# Patient Record
Sex: Male | Born: 1950 | Race: Black or African American | Hispanic: No | Marital: Single | Smoking: Current every day smoker
Health system: Southern US, Community
[De-identification: ages and names within clinical notes are randomized; demographics above are authoritative.]

## PROBLEM LIST (undated history)

## (undated) DIAGNOSIS — J449 Chronic obstructive pulmonary disease, unspecified: Secondary | ICD-10-CM

## (undated) DIAGNOSIS — C229 Malignant neoplasm of liver, not specified as primary or secondary: Secondary | ICD-10-CM

## (undated) DIAGNOSIS — I10 Essential (primary) hypertension: Secondary | ICD-10-CM

## (undated) DIAGNOSIS — R918 Other nonspecific abnormal finding of lung field: Secondary | ICD-10-CM

## (undated) HISTORY — DX: Essential (primary) hypertension: I10

## (undated) HISTORY — DX: Other nonspecific abnormal finding of lung field: R91.8

## (undated) HISTORY — DX: Chronic obstructive pulmonary disease, unspecified: J44.9

---

## 1998-07-09 ENCOUNTER — Emergency Department (HOSPITAL_COMMUNITY): Admission: EM | Admit: 1998-07-09 | Discharge: 1998-07-09 | Payer: Self-pay | Admitting: Emergency Medicine

## 1998-07-19 ENCOUNTER — Emergency Department (HOSPITAL_COMMUNITY): Admission: EM | Admit: 1998-07-19 | Discharge: 1998-07-19 | Payer: Self-pay | Admitting: Emergency Medicine

## 1999-05-29 ENCOUNTER — Encounter: Payer: Self-pay | Admitting: Emergency Medicine

## 1999-05-29 ENCOUNTER — Emergency Department (HOSPITAL_COMMUNITY): Admission: EM | Admit: 1999-05-29 | Discharge: 1999-05-29 | Payer: Self-pay | Admitting: Emergency Medicine

## 1999-06-06 ENCOUNTER — Emergency Department (HOSPITAL_COMMUNITY): Admission: EM | Admit: 1999-06-06 | Discharge: 1999-06-06 | Payer: Self-pay | Admitting: Emergency Medicine

## 1999-06-29 ENCOUNTER — Encounter: Payer: Self-pay | Admitting: Emergency Medicine

## 1999-06-29 ENCOUNTER — Emergency Department (HOSPITAL_COMMUNITY): Admission: EM | Admit: 1999-06-29 | Discharge: 1999-06-29 | Payer: Self-pay | Admitting: Emergency Medicine

## 2000-04-09 ENCOUNTER — Emergency Department (HOSPITAL_COMMUNITY): Admission: EM | Admit: 2000-04-09 | Discharge: 2000-04-09 | Payer: Self-pay | Admitting: Emergency Medicine

## 2000-04-10 ENCOUNTER — Encounter: Payer: Self-pay | Admitting: Emergency Medicine

## 2000-07-02 ENCOUNTER — Emergency Department (HOSPITAL_COMMUNITY): Admission: EM | Admit: 2000-07-02 | Discharge: 2000-07-02 | Payer: Self-pay | Admitting: Emergency Medicine

## 2000-07-16 ENCOUNTER — Emergency Department (HOSPITAL_COMMUNITY): Admission: EM | Admit: 2000-07-16 | Discharge: 2000-07-16 | Payer: Self-pay | Admitting: Emergency Medicine

## 2002-08-02 ENCOUNTER — Inpatient Hospital Stay (HOSPITAL_COMMUNITY): Admission: EM | Admit: 2002-08-02 | Discharge: 2002-08-22 | Payer: Self-pay | Admitting: Emergency Medicine

## 2002-08-02 ENCOUNTER — Encounter: Payer: Self-pay | Admitting: Infectious Diseases

## 2002-08-02 ENCOUNTER — Encounter: Payer: Self-pay | Admitting: Emergency Medicine

## 2002-08-03 ENCOUNTER — Encounter: Payer: Self-pay | Admitting: Infectious Diseases

## 2002-08-04 ENCOUNTER — Encounter: Payer: Self-pay | Admitting: Infectious Diseases

## 2002-08-04 ENCOUNTER — Encounter: Payer: Self-pay | Admitting: Internal Medicine

## 2002-08-05 ENCOUNTER — Encounter: Payer: Self-pay | Admitting: Infectious Diseases

## 2002-08-06 ENCOUNTER — Encounter: Payer: Self-pay | Admitting: Infectious Diseases

## 2002-08-07 ENCOUNTER — Encounter: Payer: Self-pay | Admitting: Infectious Diseases

## 2002-08-08 ENCOUNTER — Encounter: Payer: Self-pay | Admitting: Infectious Diseases

## 2002-08-09 ENCOUNTER — Encounter: Payer: Self-pay | Admitting: Infectious Diseases

## 2002-08-10 ENCOUNTER — Encounter: Payer: Self-pay | Admitting: Infectious Diseases

## 2002-08-11 ENCOUNTER — Encounter: Payer: Self-pay | Admitting: Infectious Diseases

## 2002-08-12 ENCOUNTER — Encounter: Payer: Self-pay | Admitting: Infectious Diseases

## 2002-08-13 ENCOUNTER — Encounter: Payer: Self-pay | Admitting: Infectious Diseases

## 2002-08-17 ENCOUNTER — Encounter: Payer: Self-pay | Admitting: Infectious Diseases

## 2002-09-02 ENCOUNTER — Ambulatory Visit (HOSPITAL_COMMUNITY): Admission: RE | Admit: 2002-09-02 | Discharge: 2002-09-02 | Payer: Self-pay | Admitting: Internal Medicine

## 2002-09-02 ENCOUNTER — Encounter: Payer: Self-pay | Admitting: Internal Medicine

## 2002-09-02 ENCOUNTER — Encounter: Admission: RE | Admit: 2002-09-02 | Discharge: 2002-09-02 | Payer: Self-pay | Admitting: Internal Medicine

## 2002-09-09 ENCOUNTER — Ambulatory Visit (HOSPITAL_COMMUNITY): Admission: RE | Admit: 2002-09-09 | Discharge: 2002-09-09 | Payer: Self-pay | Admitting: Internal Medicine

## 2002-09-09 ENCOUNTER — Encounter: Payer: Self-pay | Admitting: Internal Medicine

## 2002-09-09 ENCOUNTER — Encounter: Admission: RE | Admit: 2002-09-09 | Discharge: 2002-09-09 | Payer: Self-pay | Admitting: Internal Medicine

## 2002-10-09 ENCOUNTER — Encounter: Admission: RE | Admit: 2002-10-09 | Discharge: 2002-10-09 | Payer: Self-pay | Admitting: Internal Medicine

## 2002-10-14 ENCOUNTER — Encounter: Admission: RE | Admit: 2002-10-14 | Discharge: 2002-10-14 | Payer: Self-pay | Admitting: Internal Medicine

## 2002-10-16 ENCOUNTER — Encounter: Payer: Self-pay | Admitting: Internal Medicine

## 2002-10-16 ENCOUNTER — Ambulatory Visit (HOSPITAL_COMMUNITY): Admission: RE | Admit: 2002-10-16 | Discharge: 2002-10-16 | Payer: Self-pay | Admitting: Internal Medicine

## 2002-10-30 ENCOUNTER — Encounter: Admission: RE | Admit: 2002-10-30 | Discharge: 2002-10-30 | Payer: Self-pay | Admitting: Internal Medicine

## 2003-01-21 ENCOUNTER — Encounter: Admission: RE | Admit: 2003-01-21 | Discharge: 2003-01-21 | Payer: Self-pay | Admitting: Internal Medicine

## 2003-02-05 ENCOUNTER — Encounter: Admission: RE | Admit: 2003-02-05 | Discharge: 2003-02-05 | Payer: Self-pay | Admitting: Internal Medicine

## 2003-02-27 ENCOUNTER — Encounter: Admission: RE | Admit: 2003-02-27 | Discharge: 2003-02-27 | Payer: Self-pay | Admitting: Internal Medicine

## 2003-03-04 ENCOUNTER — Ambulatory Visit (HOSPITAL_COMMUNITY): Admission: RE | Admit: 2003-03-04 | Discharge: 2003-03-04 | Payer: Self-pay | Admitting: Internal Medicine

## 2003-03-04 ENCOUNTER — Encounter: Payer: Self-pay | Admitting: Internal Medicine

## 2003-04-22 ENCOUNTER — Encounter: Admission: RE | Admit: 2003-04-22 | Discharge: 2003-04-22 | Payer: Self-pay | Admitting: Internal Medicine

## 2003-07-20 ENCOUNTER — Encounter: Payer: Self-pay | Admitting: Gastroenterology

## 2003-07-20 ENCOUNTER — Ambulatory Visit (HOSPITAL_COMMUNITY): Admission: RE | Admit: 2003-07-20 | Discharge: 2003-07-20 | Payer: Self-pay | Admitting: Gastroenterology

## 2004-01-16 ENCOUNTER — Emergency Department (HOSPITAL_COMMUNITY): Admission: EM | Admit: 2004-01-16 | Discharge: 2004-01-17 | Payer: Self-pay | Admitting: Emergency Medicine

## 2004-01-20 ENCOUNTER — Encounter: Admission: RE | Admit: 2004-01-20 | Discharge: 2004-01-20 | Payer: Self-pay | Admitting: Internal Medicine

## 2004-01-27 ENCOUNTER — Emergency Department (HOSPITAL_COMMUNITY): Admission: EM | Admit: 2004-01-27 | Discharge: 2004-01-27 | Payer: Self-pay | Admitting: Emergency Medicine

## 2004-01-28 ENCOUNTER — Encounter: Admission: RE | Admit: 2004-01-28 | Discharge: 2004-01-28 | Payer: Self-pay | Admitting: Internal Medicine

## 2004-02-11 ENCOUNTER — Encounter: Admission: RE | Admit: 2004-02-11 | Discharge: 2004-02-11 | Payer: Self-pay | Admitting: Internal Medicine

## 2005-04-07 ENCOUNTER — Emergency Department (HOSPITAL_COMMUNITY): Admission: EM | Admit: 2005-04-07 | Discharge: 2005-04-07 | Payer: Self-pay | Admitting: Emergency Medicine

## 2008-11-10 ENCOUNTER — Emergency Department (HOSPITAL_COMMUNITY): Admission: EM | Admit: 2008-11-10 | Discharge: 2008-11-10 | Payer: Self-pay | Admitting: Emergency Medicine

## 2009-02-25 ENCOUNTER — Emergency Department (HOSPITAL_COMMUNITY): Admission: EM | Admit: 2009-02-25 | Discharge: 2009-02-25 | Payer: Self-pay | Admitting: Emergency Medicine

## 2009-03-01 ENCOUNTER — Ambulatory Visit: Payer: Self-pay | Admitting: *Deleted

## 2009-03-25 ENCOUNTER — Ambulatory Visit: Payer: Self-pay | Admitting: Internal Medicine

## 2009-04-21 ENCOUNTER — Ambulatory Visit: Payer: Self-pay | Admitting: Internal Medicine

## 2009-04-29 ENCOUNTER — Encounter: Payer: Self-pay | Admitting: Internal Medicine

## 2009-04-29 ENCOUNTER — Ambulatory Visit (HOSPITAL_COMMUNITY): Admission: RE | Admit: 2009-04-29 | Discharge: 2009-04-29 | Payer: Self-pay | Admitting: Internal Medicine

## 2009-04-29 ENCOUNTER — Ambulatory Visit: Payer: Self-pay | Admitting: Vascular Surgery

## 2009-04-29 LAB — PULMONARY FUNCTION TEST

## 2009-05-12 ENCOUNTER — Encounter: Admission: RE | Admit: 2009-05-12 | Discharge: 2009-06-03 | Payer: Self-pay | Admitting: Internal Medicine

## 2009-05-20 ENCOUNTER — Ambulatory Visit: Payer: Self-pay | Admitting: Internal Medicine

## 2009-05-20 ENCOUNTER — Encounter: Payer: Self-pay | Admitting: Family Medicine

## 2009-05-20 LAB — CONVERTED CEMR LAB
AST: 88 units/L — ABNORMAL HIGH (ref 0–37)
Albumin: 4.2 g/dL (ref 3.5–5.2)
Alkaline Phosphatase: 93 units/L (ref 39–117)
BUN: 18 mg/dL (ref 6–23)
Creatinine, Ser: 0.82 mg/dL (ref 0.40–1.50)
Glucose, Bld: 139 mg/dL — ABNORMAL HIGH (ref 70–99)
HDL: 60 mg/dL (ref >39)
LDL Cholesterol: 84 mg/dL (ref 0–99)
Microalb, Ur: 0.72 mg/dL (ref 0.00–1.89)
Potassium: 4.2 meq/L (ref 3.5–5.3)
Total Bilirubin: 0.8 mg/dL (ref 0.3–1.2)
Total CHOL/HDL Ratio: 2.6
Triglycerides: 52 mg/dL (ref <150)

## 2009-05-26 ENCOUNTER — Encounter: Payer: Self-pay | Admitting: Family Medicine

## 2009-06-02 ENCOUNTER — Ambulatory Visit: Payer: Self-pay | Admitting: Internal Medicine

## 2009-06-02 ENCOUNTER — Encounter: Payer: Self-pay | Admitting: Family Medicine

## 2009-06-02 LAB — CONVERTED CEMR LAB
Hep A Total Ab: POSITIVE — AB
Hepatitis B Surface Ag: NEGATIVE

## 2009-06-03 ENCOUNTER — Encounter: Payer: Self-pay | Admitting: Family Medicine

## 2009-06-30 ENCOUNTER — Ambulatory Visit: Payer: Self-pay | Admitting: Internal Medicine

## 2009-07-28 ENCOUNTER — Emergency Department (HOSPITAL_COMMUNITY): Admission: EM | Admit: 2009-07-28 | Discharge: 2009-07-28 | Payer: Self-pay | Admitting: Emergency Medicine

## 2009-08-27 ENCOUNTER — Ambulatory Visit: Payer: Self-pay | Admitting: Internal Medicine

## 2009-10-07 ENCOUNTER — Ambulatory Visit: Payer: Self-pay | Admitting: Internal Medicine

## 2009-11-22 ENCOUNTER — Telehealth (INDEPENDENT_AMBULATORY_CARE_PROVIDER_SITE_OTHER): Payer: Self-pay | Admitting: *Deleted

## 2010-01-06 ENCOUNTER — Ambulatory Visit: Payer: Self-pay | Admitting: Internal Medicine

## 2010-03-02 ENCOUNTER — Ambulatory Visit: Payer: Self-pay | Admitting: Internal Medicine

## 2010-03-18 ENCOUNTER — Encounter: Payer: Self-pay | Admitting: Family Medicine

## 2010-04-05 ENCOUNTER — Ambulatory Visit: Payer: Self-pay | Admitting: Internal Medicine

## 2010-04-05 LAB — CONVERTED CEMR LAB
ALT: 159 units/L — ABNORMAL HIGH (ref 0–53)
Albumin: 4.2 g/dL (ref 3.5–5.2)
Alkaline Phosphatase: 92 units/L (ref 39–117)
CO2: 28 meq/L (ref 19–32)
Potassium: 4.7 meq/L (ref 3.5–5.3)
Sodium: 140 meq/L (ref 135–145)
Total Bilirubin: 0.5 mg/dL (ref 0.3–1.2)
Total Protein: 8.1 g/dL (ref 6.0–8.3)

## 2010-04-27 ENCOUNTER — Ambulatory Visit (HOSPITAL_COMMUNITY): Admission: RE | Admit: 2010-04-27 | Discharge: 2010-04-27 | Payer: Self-pay | Admitting: Internal Medicine

## 2010-05-19 ENCOUNTER — Ambulatory Visit: Payer: Self-pay | Admitting: Internal Medicine

## 2010-10-15 ENCOUNTER — Encounter: Payer: Self-pay | Admitting: Gastroenterology

## 2010-10-16 ENCOUNTER — Encounter: Payer: Self-pay | Admitting: Internal Medicine

## 2010-10-25 NOTE — Letter (Signed)
Summary: Letter  Letter   Imported By: Lind Guest 03/21/2010 11:31:39  _____________________________________________________________________  External Attachment:    Type:   Image     Comment:   External Document

## 2010-10-25 NOTE — Progress Notes (Signed)
Summary: triage/pain  Phone Note Call from Patient   Caller: Patient Reason for Call: Talk to Nurse Summary of Call: patient states he has been having left arm and left leg pain for about a year.Marland KitchenMarland KitchenHe is calling because it has become severe and he now cannot sleep.Marland KitchenHe denies chest pain or SOB...appointment made in Dr. Benjaman Lobe schedule...at patients request.. Initial call taken by: Conchita Paris,  November 22, 2009 11:21 AM

## 2010-11-25 ENCOUNTER — Encounter (INDEPENDENT_AMBULATORY_CARE_PROVIDER_SITE_OTHER): Payer: Self-pay | Admitting: Family Medicine

## 2010-11-25 LAB — CONVERTED CEMR LAB
ALT: 141 units/L — ABNORMAL HIGH (ref 0–53)
AST: 95 units/L — ABNORMAL HIGH (ref 0–37)
Alkaline Phosphatase: 83 units/L (ref 39–117)
Basophils Absolute: 0 10*3/uL (ref 0.0–0.1)
Basophils Relative: 0 % (ref 0–1)
Creatinine, Ser: 0.73 mg/dL (ref 0.40–1.50)
Eosinophils Absolute: 0 10*3/uL (ref 0.0–0.7)
Eosinophils Relative: 1 % (ref 0–5)
HCT: 48.9 % (ref 39.0–52.0)
Hemoglobin: 17 g/dL (ref 13.0–17.0)
MCHC: 34.8 g/dL (ref 30.0–36.0)
Monocytes Absolute: 0.4 10*3/uL (ref 0.1–1.0)
PSA: 0.41 ng/mL (ref <=4.00)
Platelets: 123 10*3/uL — ABNORMAL LOW (ref 150–400)
RDW: 13.7 % (ref 11.5–15.5)
Sodium: 141 meq/L (ref 135–145)
Total Bilirubin: 0.6 mg/dL (ref 0.3–1.2)

## 2011-01-02 LAB — DIFFERENTIAL
Basophils Absolute: 0 10*3/uL (ref 0.0–0.1)
Basophils Relative: 1 % (ref 0–1)
Eosinophils Relative: 1 % (ref 0–5)
Monocytes Absolute: 0.5 10*3/uL (ref 0.1–1.0)
Neutro Abs: 2 10*3/uL (ref 1.7–7.7)

## 2011-01-02 LAB — COMPREHENSIVE METABOLIC PANEL
Albumin: 3.4 g/dL — ABNORMAL LOW (ref 3.5–5.2)
Alkaline Phosphatase: 95 U/L (ref 39–117)
BUN: 9 mg/dL (ref 6–23)
CO2: 27 mEq/L (ref 19–32)
Chloride: 104 mEq/L (ref 96–112)
Potassium: 4 mEq/L (ref 3.5–5.1)
Total Bilirubin: 1 mg/dL (ref 0.3–1.2)

## 2011-01-02 LAB — CBC
HCT: 48.8 % (ref 39.0–52.0)
Hemoglobin: 16 g/dL (ref 13.0–17.0)
Platelets: 149 10*3/uL — ABNORMAL LOW (ref 150–400)
RBC: 5.41 MIL/uL (ref 4.22–5.81)
WBC: 4 10*3/uL (ref 4.0–10.5)

## 2011-01-02 LAB — BRAIN NATRIURETIC PEPTIDE: Pro B Natriuretic peptide (BNP): <30 pg/mL (ref 0.0–100.0)

## 2011-01-10 LAB — DIFFERENTIAL
Eosinophils Absolute: 0 10*3/uL (ref 0.0–0.7)
Eosinophils Relative: 1 % (ref 0–5)
Lymphs Abs: 1.3 10*3/uL (ref 0.7–4.0)
Monocytes Relative: 11 % (ref 3–12)

## 2011-01-10 LAB — COMPREHENSIVE METABOLIC PANEL
ALT: 77 U/L — ABNORMAL HIGH (ref 0–53)
AST: 55 U/L — ABNORMAL HIGH (ref 0–37)
Calcium: 9.5 mg/dL (ref 8.4–10.5)
GFR calc Af Amer: >60 mL/min (ref >60)
Sodium: 136 mEq/L (ref 135–145)
Total Protein: 8.2 g/dL (ref 6.0–8.3)

## 2011-01-10 LAB — CBC
MCHC: 34.1 g/dL (ref 30.0–36.0)
RBC: 5.68 MIL/uL (ref 4.22–5.81)
RDW: 13.6 % (ref 11.5–15.5)

## 2011-01-10 LAB — POCT CARDIAC MARKERS
CKMB, poc: <1 ng/mL — ABNORMAL LOW (ref 1.0–8.0)
Myoglobin, poc: 21.6 ng/mL (ref 12–200)

## 2011-01-10 LAB — URINALYSIS, ROUTINE W REFLEX MICROSCOPIC
Glucose, UA: NEGATIVE mg/dL
Hgb urine dipstick: NEGATIVE
Ketones, ur: NEGATIVE mg/dL
Protein, ur: NEGATIVE mg/dL

## 2011-01-10 LAB — D-DIMER, QUANTITATIVE: D-Dimer, Quant: 0.76 ug/mL-FEU — ABNORMAL HIGH (ref 0.00–0.48)

## 2011-01-11 ENCOUNTER — Other Ambulatory Visit: Payer: Self-pay | Admitting: Internal Medicine

## 2011-01-12 NOTE — Telephone Encounter (Signed)
This is a [facility] pt, please send a refusal

## 2011-02-10 NOTE — Discharge Summary (Signed)
NAMERAEGAN, SIPP                           ACCOUNT NO.:  [id number]   MEDICAL RECORD NO.:  [id number]                   PATIENT TYPE:  INP   LOCATION:  5020                                 FACILITY:  [facility]   PHYSICIAN:  Fransisco Hertz, M.D.               DATE OF BIRTH:  07-09-1951   DATE OF ADMISSION:  08/02/2002  DATE OF DISCHARGE:  08/22/2002                                 DISCHARGE SUMMARY   ACTING INTERN:  Noelle Simon-McLeurin   DISCHARGE DIAGNOSES:  1. Ventilator-dependent respiratory failure secondary to pneumonia.     a. Sepsis secondary to pneumonia.     b. Blood cultures, 2/2 positive for Streptococcus pneumoniae.     c. Sputum cultures also positive for Streptococcus pneumoniae.  2. Metabolic acidosis plus acute respiratory acidosis.     a. Resolved, as the patient was taken off the ventilator.  3. Hepatitis C positive.     a. The patient received one vaccination of Hepatitis B before discharge.     b. His viral load was 4,500,000.     c. A antigen pending.  4. Alcohol abuse.  5. Hyponatremia, resolved during this hospitalization.  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  6. Hyperglycemia, also resolved on discharge.  Hemoglobin A1C 7.1.  The     patient will follow up for diabetic screening on discharge.  7. Urinary tract infection, treated during this hospitalization.  8. Acute renal failure, resolved.  9. History of hypertension.  Blood pressure remained normal during this     hospitalization.   DISCHARGE MEDICATIONS:  None.   PROCEDURES:  He was intubated on the 8th, extubated on the 18th.  He had an  arterial line on the 8th that was discontinued on the 18th of November.   CONSULTATIONS:  Pulmonary Critical Care was consulted.   ADMISSION HISTORY:  The patient is a 60 year old African-American male that  presented to the emergency department with cough, shortness of breath  that  got progressively worse over the past few days.  Also has a history of  diarrhea.  He complained of hemoptysis.  He is homeless and was staying with  a friend.  All of this report was got through EMS.  He presented to the  emergency department.  His saturations were in the 87%.  He was tachypneic.  He was intubated to ventilation and oxygenation.   ALLERGIES:  No known drug allergies.   PAST MEDICAL HISTORY:  Unable to be obtained secondary to patient being  intubated.  He was on no medications prior to admission.   SOCIAL HISTORY:  He is positive for tobacco use and heavy alcohol use.  Single.  He was a Restaurant manager, fast food, homeless, and was staying with  friends.   FAMILY HISTORY:  Positive for diabetes, hypertension per his  niece.   REVIEW OF SYSTEMS:  Unable to be obtained when this patient was admitted.   PHYSICAL EXAMINATION ON ADMISSION:  VITAL SIGNS:  Pulse 141, blood pressure  104/78, temperature 97.3, respirations 38.  O2 saturations 89% on 15 liters  non-rebreather.  GENERAL:  He was intubated, sedated, paralyzed, diaphoretic.  HEENT:  Pupils equal, round, and reactive to light.  He had poor dentition.  LUNGS:  Diffuse rhonchi at the bases.  CARDIOVASCULAR:  Tachycardic but regular rhythm.  No murmurs, rubs, or  gallops.  ABDOMEN:  Positive bowel sounds.  EXTREMITIES:  Edema, 2+ femoral pulses.  GU:  He had a Foley in place.  SKIN:  Diaphoretic.  No rashes.  NEUROLOGICAL:  Mental status was unable to do secondary to patient being  sedated, paralyzed, and on the ventilator.   HOSPITAL COURSE:  Problem 1.  VENTILATOR-DEPENDENT RESPIRATORY FAILURE,  SECONDARY TO PNEUMONIA/SEPSIS:  The patient was admitted to the Intensive  Care Unit.  Blood cultures, sputum cultures, and chest x-ray were obtained.  He was started on broad spectrum antibiotic.  He was given Unasyn,  Zithromax, and Vancomycin and he was also started on the Xigris protocol.  He was given several  boluses of fluid.  His blood pressure corrected and he  remained stable over the next few days.  Blood cultures then grew out Strep  pneumoniae x2.  Sputum cultures also had Strep pneumoniae.  His antibiotic  coverage was narrowed.  He was placed on ceftriaxone.  His vancomycin,  Unasyn, and Zithromax were discontinued.  He remained afebrile, improved  throughout his stay, and was extubated on the 18th and did very well post  extubation.  He also remained afebrile after his pneumoniae was being  treated.  Subsequent blood cultures were negative.  Problem 2.  HYPOTENSION:  He was hypotensive on admission.  He was given  several boluses of fluid.  He was started on Levophed drip.  His blood  pressure stabilized.  He was taken off the drip and blood pressure remained  stable throughout the rest of his stay.  Problem 3.  METABOLIC ACIDOSIS:  He had an anion gap when he was admitted,  most likely secondary to lactic acid buildup.  During his hospitalization,  his electrolyte abnormality corrected with hydration.  Problem 4.  UTI:  His urine was positive for leukocyte esterase and his  antibiotic that he was on also covered his urinary tract infection.  Problem 5.  HEPATITIS C:  His liver enzymes were elevated.  This was thought  to be most likely secondary to alcohol versus acute hepatitis.  He was  hepatitis-C positive during his hospitalization.  He was counseled that  because of his hepatitis C status he should not drink or that would really  lead to cirrhosis and probably liver cancer.  The patient agreed with this  and stated that he would try not to drink in the future.  He was also given  the number for AA to call for assistance.  He was checked for hepatitis A.  His antigen results were pending on discharge.  If they are negative on  follow up he will get the vaccine.  He was also checked for HIV and that was  negative during this hospitalization. Problem 6.  HYPERGLYCEMIA:  He had a  couple of episodes of increased blood  sugar.  His hemoglobin A1C was 7.1.  On follow up he should get a fasting  glucose test.  Problem 7.  ACUTE  RENAL FAILURE:  He had acute renal failure with elevated  creatinine on his admission, most likely secondary to dehydration.  After he  was hydrated, his acute renal failure resolved and his creatinine corrected.   DISPOSITION:  He stated that he will not go back to staying with friends  because of being just acutely ill.  He will stay with his niece, who will  have to take care of him.   DISCHARGE LABS:  Sodium 134, potassium 4.0, chloride 102, CO2 29, glucose  116, BUN 7, creatinine 0.7, calcium 8.5.  He was RPR negative.  TSH was  1.742.  His hepatitis B surface antigen was negative, hepatitis B core  antibody negative, hepatitis B envelope antibody negative and antigen  negative.  Hepatitis B surface antibody negative.  Hepatitis C PCR was  positive, XC RNA was 77,530,000.  Hepatitis C virus RNA was 1,610,960.  Repeat blood cultures were negative x2.  His hemoglobin A1C was 7.1.  HIV  negative.  Legionella antigen, urine for Legionella antigen negative.  Stool  was negative for blood.   RADIOLOGIC DATA:  His initial x-ray showed a right lower lobe pneumonia.     Ladell Pier, M.D.                      Fransisco Hertz, M.D.    [place]/MEDQ  D:  08/22/2002  T:  08/22/2002  Job:  [id number]   cc:   Outpatient Clinic

## 2011-02-10 NOTE — Consult Note (Signed)
John Casey, John Casey                           ACCOUNT NO.:  [id number]   MEDICAL RECORD NO.:  [id number]                   PATIENT TYPE:  INP   LOCATION:  2111                                 FACILITY:  [facility]   PHYSICIAN:  Oley Balm. Sung Amabile, M.D. [facility]          DATE OF BIRTH:  1951-06-07   DATE OF CONSULTATION:  08/02/2002  DATE OF DISCHARGE:                                   CONSULTATION   REQUESTING PHYSICIAN:  Internal medicine teaching service.   REASON FOR CONSULTATION:  Vent-dependent respiratory failure.   HISTORY OF PRESENT ILLNESS:  Mr. Margraf is a 60 year old African-American  gentleman who is homeless and presently living with friends.  He has a  history of alcohol abuse.  He smokes a pack of cigarettes per day.  He  presented to the emergency department on the morning of  August 02, 2002,  with fever, cough, purulent sputum, and respiratory distress.  He required  urgent intubation.  Therefore, no further history is available to [place].   PAST MEDICAL HISTORY:  According to the records, he has no other significant  medical history.   SOCIAL HISTORY:  He apparently smokes and drinks alcohol excessively,  homeless, and previously employed as a Restaurant manager, fast food.   FAMILY HISTORY:  Not available.   REVIEW OF SYMPTOMS:  Not available.   PHYSICAL EXAMINATION:  GENERAL:  Intubated gentleman who is diaphoretic and  tachypneic on the ventilator.  He is unable to open his eyes and respond to  my questions.  HEENT:  No acute abnormalities.  NECK:  Supple without adenopathy.  Jugular venous pulsations cannot be  assessed.  CHEST:  Examination reveals coarse rhonchi with slightly prolonged  expiratory wheezes.  CARDIAC:  Cardiac exam reveals tachycardia with a regular rhythm and no  murmurs.  ABDOMEN:  Soft with normal bowel sounds.  EXTREMITIES:  Without clubbing, cyanosis, or edema.  NEUROLOGICAL:  Exam reveals no focal deficits.   DATA:  Chest x-ray reveals right lower  lobe consolidation consistent with  pneumonia.  Chemistries are notable for a sodium of 132, carbon dioxide of  18, glucose 161, BUN 38, creatinine 2.4, SGOT 115, SGPT 123, bilirubin 4.9.  CBC reveals normal hematocrit and hemoglobin.  White blood cell count is  less than 1.5.  Platelet count is 118,000.  There is a left shift with  greater than 20% bands on the white blood cell differential.   IMPRESSION:  1. Ventilator-dependent respiratory failure secondary to pneumonia - most     likely etiology is pneumococcus.  2. Severe sepsis with respiratory failure, neutropenic, mild     thrombocytopenia, and acute renal insufficiency.  3. Smoker.  4. Increased liver function tests secondary to alcoholic sepsis.  5. Hyperglycemia.   DISCUSSION:  Given his poor oral dentition, social situation, and  radiographic picture, I believe we should cover for aspiration pneumonia,  including anaerobic organisms.   PLAN/RECOMMENDATIONS:  1.  Vent settings have been established.  2. Sedation protocol, GI prophylaxis, and DVT prophylaxis.  3. He is a candidate for Xigris given severe sepsis and high risk of death.  4. Broad spectrum antibiotics with Unasyn and azithromycin after all     cultures have been obtained.  5. Empiric bronchodilator therapy.  6. Volume resuscitation with a goal of a systolic pressure greater than 100     and a mean arterial pressure greater than 60.  7. Tight glycemic control with a goal of blood glucoses in the 100-150     range.                                               Oley Balm Sung Amabile, M.D. [facility]    DBS/MEDQ  D:  08/03/2002  T:  08/04/2002  Job:  [id number]

## 2011-03-16 ENCOUNTER — Other Ambulatory Visit: Payer: Self-pay | Admitting: Internal Medicine

## 2011-03-30 ENCOUNTER — Ambulatory Visit (INDEPENDENT_AMBULATORY_CARE_PROVIDER_SITE_OTHER): Payer: Medicaid Other | Admitting: Gastroenterology

## 2011-03-30 VITALS — BP 156/101 | HR 108 | Temp 98.5°F | Ht 71.0 in | Wt 261.0 lb

## 2011-03-30 DIAGNOSIS — B182 Chronic viral hepatitis C: Secondary | ICD-10-CM

## 2011-04-04 ENCOUNTER — Other Ambulatory Visit: Payer: Self-pay | Admitting: Gastroenterology

## 2011-04-04 DIAGNOSIS — B1920 Unspecified viral hepatitis C without hepatic coma: Secondary | ICD-10-CM

## 2011-04-06 NOTE — Progress Notes (Signed)
NAME:  John Casey, John Casey    MR#:  [id number]      DATE:  03/30/2011  DOB:  1951-08-13    cc: Referring Physician:  Dow Adolph, MD, [facility] [address], 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    History:  The patient is a 60 year old gentleman who I have been asked to see in consultation by Dr. Audria Nine regarding his genotype 1a hepatitis C.  According to the patient, he was diagnosed sometime around 2003, but cannot recall the details of what transpired nor was he seen by our clinic or biopsied for his hepatitis C. He remains naive to treatment.  There are currently no symptoms referable to his history hepatitis C. There are no symptoms to suggest cryoglobulin mediated or decompensated liver disease.   With respect to risk factors for liver disease, he consumed approximately 12-18 beers per week for years with significant reduction in alcohol intake in last 3 months. He drank 24 ounces of  beer once in the past 3 months. He never drove so he does not have any history of DWIs. He denies any history of intravenous or intranasal drug use, blood transfusions, unsterile body piercing or tattooing.  There is no family history of liver disease. He has not been vaccinated against hepatitis A or B that he can recall.   PAST MEDICAL HISTORY:  Significant for type 2 diabetes. He reports his last Hemoglobin A1c was is 7%. He checks his blood sugars once daily and reports a.m.  fasting blood sugar today was 145. He denies any history of dyslipidemia, coronary disease, hypertension, or dysthyroidism.   PAST SURGICAL HISTORY:  Denies.   Past psychiatric history:  Denies.   CURRENT MEDICATIONS:  Metformin 1000 mg b.i.d., naproxen 1000 mg b.i.d. p.r.n. for pain, cetirizine 10 mg daily p.r.n. for allergies, gabapentin 300 mg p.o. t.i.d. for foot pain, as well as shoulder and leg pain,  hydrocodone/acetaminophen  10/500 mg p.o. t.i.d. p.r.n. for pain, Spiriva HandiHaler 1 inhalation daily, Proventil 2 inhalations daily.   ALLERGIES:  Denies.    habits:  Smoking, has gone through episodes where he has quit and then resumed smoking, currently is abstaining. Alcohol as above.   FAMILY HISTORY:  As above.   SOCIAL HISTORY:  Single without children. 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 Lives in a boarding house but he has siblings in town. He came today on the public transportation.   REVIEW OF SYSTEMS:  All 10 systems reviewed today with the patient from the review of systems form, which was signed and placed in the chart. CES-D was incomplete but he ate filled out do by himself.   PHYSICAL EXAMINATION:   Constitutional:  Central obesity, but otherwise well appearing. Vital signs: Height 71 inches, weight 261 pounds blood pressure 156/101, pulse of 108, temperature 98.5 Fahrenheit.  Ears, nose, mouth and  throat:  Unremarkable oropharynx.  No thyromegaly or neck masses.  Chest:  Resonant to percussion.  Clear to auscultation.  Cardiovascular:  Heart sounds normal S1, S2 without murmurs or rubs.   There is no peripheral edema.  Abdominal:  Normal bowel sounds.  No masses or tenderness.  I could not appreciate a liver edge or spleen tip.  I could not appreciate any hernias.  Lymphatics:  No cervical or  inguinal lymphadenopathy.  Central Nervous System:  No asterixis or focal neurologic  findings.  Dermatologic:  Anicteric without palmar  erythema or spider angiomata.  Eyes:  Anicteric sclerae.  Pupils are equal and reactive to light.    LABORATORY STUDIES:  From. 01/06/2011, hepatitis C genotype 1a HCV RNA was 729,000 international units per mL, total B core antibody and hepatitis B  surface antibody were negative. Total A antibody was positive. His hepatitis C antibody was positive.   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 I do not see any overt contraindication to treatment but it would be helpful for him to bring  family with him so that I can review treatment protocol with him and others. I think he is sufficiently abstinent from alcohol so I do not think his alcohol use should preclude him from being treated if he is  otherwise a candidate.  In my discussion today with [name], I discussed the nature and natural history of genotype 1a hepatitis C. We discussed the role of biopsy in determining severity disease and need for treatment. I then  reviewed then reviewed with him treatment with combination of pegylated interferon and ribavirin and a protease inhibitor. I reviewed our treatment protocol for clinic, as well as the specific  system, constitutional, psychiatric side effects of therapy. I reviewed the response rates, as well. I also reviewed the risk of  contagion and any instructions remained completely abstinent from alcohol.   plan:  1. He received his first hepatitis B vaccination today, and will need to return in a month's time for the second, and in 6 months' time for the third. 2. He is hepatitis A immune. 3. Will book for biopsy in the coming weeks. 4. I will check standard labs including hemoglobin A1c, and IL-28B. 5. Literature on hepatitis C given. 6. He is to return after his biopsy to review the results and if all acceptable I will file the prior authorization paperwork for treatment.  7. I have asked him to bring family members with him so I can discuss treatment with them, as well.  He indicates perhaps his niece could come in to at least drive him for a liver biopsy so perhaps she can come in with him for his clinic appointments.            Brooke Dare, MD   ADDENDUM:   HgB A1C 7.9%  IL28B CT  [phone]  D:  Thu Jul 05 17:57:48 2012 ; T:  Thu Jul 05 20:08:43 2012  Job #:  [id number]

## 2011-04-19 ENCOUNTER — Other Ambulatory Visit: Payer: Self-pay | Admitting: Interventional Radiology

## 2011-04-19 ENCOUNTER — Ambulatory Visit (HOSPITAL_COMMUNITY): Payer: Medicaid Other

## 2011-04-19 ENCOUNTER — Ambulatory Visit (HOSPITAL_COMMUNITY)
Admission: RE | Admit: 2011-04-19 | Discharge: 2011-04-19 | Disposition: A | Discharge status: Home / Self Care | Payer: Medicaid Other | Source: Ambulatory Visit | Attending: Gastroenterology | Admitting: Gastroenterology

## 2011-04-19 DIAGNOSIS — B182 Chronic viral hepatitis C: Secondary | ICD-10-CM | POA: Insufficient documentation

## 2011-04-19 DIAGNOSIS — B1920 Unspecified viral hepatitis C without hepatic coma: Secondary | ICD-10-CM

## 2011-04-19 LAB — GLUCOSE, CAPILLARY: Glucose-Capillary: 117 mg/dL — ABNORMAL HIGH (ref 70–99)

## 2011-04-19 LAB — PROTIME-INR: Prothrombin Time: 13.1 seconds (ref 11.6–15.2)

## 2011-04-19 LAB — CBC
MCH: 29.2 pg (ref 26.0–34.0)
Platelets: 136 10*3/uL — ABNORMAL LOW (ref 150–400)
RBC: 5.34 MIL/uL (ref 4.22–5.81)
RDW: 13.2 % (ref 11.5–15.5)

## 2011-04-27 ENCOUNTER — Ambulatory Visit (INDEPENDENT_AMBULATORY_CARE_PROVIDER_SITE_OTHER): Payer: Medicaid Other | Admitting: Gastroenterology

## 2011-04-27 DIAGNOSIS — B182 Chronic viral hepatitis C: Secondary | ICD-10-CM

## 2011-05-03 ENCOUNTER — Other Ambulatory Visit (HOSPITAL_COMMUNITY): Payer: Self-pay | Admitting: Family Medicine

## 2011-05-03 DIAGNOSIS — M545 Low back pain: Secondary | ICD-10-CM

## 2011-05-03 DIAGNOSIS — R918 Other nonspecific abnormal finding of lung field: Secondary | ICD-10-CM

## 2011-05-04 NOTE — Progress Notes (Signed)
NAME:  John Casey, John Casey    MR#:  [id number]      DATE:  04/27/2011  DOB:  1951/01/02    cc: Referring Physician:  Dow Adolph, MD, [address], [address] [place], [place] [address], Fax [phone]    REASON FOR VISIT:  Followup genotype 1 HCV to discuss liver biopsy results and vaccination.    history:  The patient returns today unaccompanied. Since last being seen on 03/30/2011, he has not had any complications related to his hepatitis  C, nor are there symptoms to suggest decompensated or cryoglobulin mediated liver disease.   PAST MEDICAL HISTORY:  Significant for type 2 diabetes. There is no change in this or any new diagnoses.   Past surgical history:  No interval change.   Past psychiatric history:  No interval change.   CURRENT MEDICATIONS:  Metformin 1000 mg p.o. b.i.d., Naprosyn 1000 mg p.o. b.i.d. p.r.n. for pain, sertraline t.i.d. 10 mg p.o. daily p.r.n. for allergies, gabapentin 300 mg p.o. t.i.d. for foot pain, as well shoulder and  leg pain, hydrocodone/acetaminophen 10/500 mg p.o. t.i.d. for pain, Spiriva HandiHaler 1 inhalation daily, Proventil 2 inhalations daily.    Allergies:  Denies.    habits:  Smoking, currently abstaining from smoking. Alcohol, denies.   REVIEW OF SYSTEMS:  All 10 systems reviewed today with the patient and they are negative other than which is mentioned above. His CES-D was incomplete.   PHYSICAL EXAMINATION:   Constitutional:  No significant peripheral wasting. He has central obesity. Vital signs: Height 71 inches, weight 261 pounds, blood pressure 155/100, pulse 100, temperature 97.7 Fahrenheit.   DIAGNOSTIC STUDIES:  On 05/02/2011; The patient underwent a liver biopsy, which yielded good sample. There were 4 cores ranging in size from 1.4 to 1.6 cm in length. 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 PAS stain was negative.    ASSESSMENT:  The patient is a 60 year old gentleman with a history of genotype 1a hepatitis C, IL 28B CT, naive to treatment. His biopsy on 04/19/2011, showed grade 2 stage zero disease with no significant iron deposition.  Despite the fact that the pathologist says that the RES and hepatocyte deposition of the iron is not consistent with hemochromatosis. 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 Overall, I think that the biopsy is quite benign and he does not need to be treated for his hepatitis C immediately. I think more  importantly he would do well to continue to abstain from alcohol, make sure his diabetes is under control, lose a few pounds, and then resurvey the therapeutic options for treatment for hepatitis C within a year's time. Perhaps by then we will be closer to having simeprevir available to [place], which will be a once a day protease inhibitor. This will certainly be easier to dose.  In my discussion today with with the patient, we discussed results of the liver biopsy and the fact that he could afford to wait to be treated until we have options such as simeprevir. We discussed the  importance of continued abstinence from alcohol, as well as control of diabetes and weight. I explained to him that I would see him in a year's time to discuss the treatment options at that time. I  emphasized he needs to return in follow up and that he is  not being dismissed from the practice.   plan:  1. He received his second hepatitis B vaccination today. 2. He is hepatitis A immune previously. 3. I have given written literature on his genotype and his biopsy findings. 4. He is return in 5 months' time for his third hepatitis B vaccine. 5. He is return in 1 year's time to discuss treatment options.            Brooke Dare, MD   [phone]  D:  Thu Aug 02 13:09:38 2012 ; T:  Thu Aug 02 13:50:55  2012  Job #:  [id number]

## 2011-05-08 ENCOUNTER — Other Ambulatory Visit (HOSPITAL_COMMUNITY): Payer: Medicaid Other

## 2011-05-09 ENCOUNTER — Ambulatory Visit (HOSPITAL_COMMUNITY)
Admission: RE | Admit: 2011-05-09 | Discharge: 2011-05-09 | Disposition: A | Discharge status: Home / Self Care | Payer: Medicaid Other | Source: Ambulatory Visit | Attending: Family Medicine | Admitting: Family Medicine

## 2011-05-09 DIAGNOSIS — R918 Other nonspecific abnormal finding of lung field: Secondary | ICD-10-CM

## 2011-05-09 DIAGNOSIS — M545 Low back pain, unspecified: Secondary | ICD-10-CM | POA: Insufficient documentation

## 2011-05-09 DIAGNOSIS — J984 Other disorders of lung: Secondary | ICD-10-CM | POA: Insufficient documentation

## 2011-05-09 MED ORDER — IOHEXOL 300 MG/ML  SOLN: 80.0000 mL | Freq: Once | INTRAMUSCULAR | Status: AC | PRN

## 2011-05-22 ENCOUNTER — Encounter: Payer: Self-pay | Admitting: Emergency Medicine

## 2011-05-23 ENCOUNTER — Ambulatory Visit (INDEPENDENT_AMBULATORY_CARE_PROVIDER_SITE_OTHER): Payer: Medicaid Other | Admitting: Emergency Medicine

## 2011-05-23 ENCOUNTER — Encounter: Payer: Self-pay | Admitting: Emergency Medicine

## 2011-05-23 DIAGNOSIS — J9819 Other pulmonary collapse: Secondary | ICD-10-CM

## 2011-05-23 DIAGNOSIS — J9811 Atelectasis: Secondary | ICD-10-CM | POA: Insufficient documentation

## 2011-05-23 DIAGNOSIS — R053 Chronic cough: Secondary | ICD-10-CM | POA: Insufficient documentation

## 2011-05-23 DIAGNOSIS — R05 Cough: Secondary | ICD-10-CM

## 2011-05-23 DIAGNOSIS — J449 Chronic obstructive pulmonary disease, unspecified: Secondary | ICD-10-CM

## 2011-05-23 DIAGNOSIS — R918 Other nonspecific abnormal finding of lung field: Secondary | ICD-10-CM | POA: Insufficient documentation

## 2011-05-23 MED ORDER — LORATADINE 10 MG PO TABS: 10.0000 mg | ORAL_TABLET | Freq: Every day | ORAL | Status: DC

## 2011-05-23 NOTE — Patient Instructions (Signed)
Please start taking Symbcort 160/4.26mcg, 2 puffs twice a day Continue your Spiriva 1 inhalation daily Continue to use Proventil as needed for shortness of breath Start loratadine 10mg  daily (Claritin) Your CT scan of the chest that was done 05/09/11 showed that your Left lung scar is stable compared with priors, and that the small pulmonary nodules seen on your last scan are also stable.  Follow up with Dr Delton Coombes in 1 month.  We will perform repeat breathing tests at some point in the future

## 2011-05-23 NOTE — Assessment & Plan Note (Signed)
Stable by CT scan compared with 02/2009

## 2011-05-23 NOTE — Progress Notes (Signed)
Subjective:    Patient ID: John Casey, male    DOB: 20-Mar-1951, 60 y.o.   MRN: [id number]  HPI 60 yo man, hx Hep C, DM, former tobacco 40 pk-yrs, COPD w FEV1 1.61 (46% pred), has been on Spiriva and Proventil x 8 months. Was formerly on Advair, stopped due to cough. He is referred for his COPD. He was treated for PNA in 2003 that required MV. He was left with LLL rounded atx that has been stable on serial films. Last CT scan was 05/09/11, showed stable since 10/2008, also stable small pulm nodules. He uses SABA about every other day. He underwent liver biopsy 3 weeks ago to evaluate for cirrhosis. He describes cough, nasal drainage and allergies. Rarely has GERD symptoms.    Review of Systems  HENT: Positive for sore throat and trouble swallowing.   Respiratory: Positive for cough and shortness of breath.   Cardiovascular: Positive for chest pain and leg swelling.  Psychiatric/Behavioral: Positive for decreased concentration.    Past Medical History  Diagnosis Date  . COPD (chronic obstructive pulmonary disease)   . Diabetes mellitus   . Hypertension   . Pulmonary nodules      Family History  Problem Relation Age of Onset  . Heart disease Father      History   Social History  . Marital Status: Single    Spouse Name: N/A    Number of Children: N/A  . Years of Education: N/A   Occupational History  . disabled    Social History Main Topics  . Smoking status: Former Smoker -- 1.0 packs/day for 38 years    Quit date: 11/26/2010  . Smokeless tobacco: Not on file  . Alcohol Use: No  . Drug Use: No  . Sexually Active: Not on file   Other Topics Concern  . Not on file   Social History Narrative  . No narrative on file     No Known Allergies   No outpatient prescriptions prior to visit.       Objective:   Physical Exam  Gen: Pleasant, obese, in no distress,  normal affect  ENT: No lesions,  mouth clear,  oropharynx clear, mild postnasal drip  Neck: No JVD, no  TMG, no carotid bruits  Lungs: No use of accessory muscles, no dullness to percussion, clear without rales or rhonchi  Cardiovascular: RRR, heart sounds normal, no murmur or gallops, trace peripheral edema  Musculoskeletal: No deformities, no cyanosis or clubbing  Neuro: alert, non focal  Skin: Warm, no lesions or rashes   CT scan 05/09/11:  Comparison: 02/25/2009 and 11/10/2008  Findings: Rounded opacity in the posterior left lower lobe remains  stable compared to prior studies and is consistent with an area of  rounded atelectasis. Other areas of pleural - parenchymal scarring  in both lungs are also stable. Other tiny bilateral pulmonary  nodules are also stable and consistent with benign postinflammatory  changes. No new or enlarging pulmonary nodules or masses are  identified, and there is no evidence of acute pulmonary infiltrate.  There is no evidence of pleural or pericardial effusion. No  evidence of hilar or mediastinal lymphadenopathy. No adenopathy  seen elsewhere within the thorax. Diffuse coronary artery  calcification again noted. The adrenal glands remain normal in  appearance.  IMPRESSION:  1. Stable posterior left lower lobe opacity, consistent with  rounded atelectasis. Stable bilateral pleural - parenchymal  scarring and tiny benign pulmonary nodules.  2. No evidence of neoplasm or other  active disease within the  thorax.  Original Report Authenticated By: Danae Orleans, M.D.      Assessment & Plan:  Chronic cough He describes allergies and PND, would like to see if we can help the cough with loratadine qd  Pulmonary nodules Stable by CT scan compared with 02/2009  Focal atelectasis Stable by CT scan on 05/09/11  COPD (chronic obstructive pulmonary disease) Continue spiriva Trial adding symbicort bid Prn saba PFT in the future to compare with priors Walking oximetry next time/.

## 2011-05-23 NOTE — Assessment & Plan Note (Signed)
Continue spiriva Trial adding symbicort bid Prn saba PFT in the future to compare with priors Walking oximetry next time/.

## 2011-05-23 NOTE — Assessment & Plan Note (Signed)
Stable by CT scan on 05/09/11

## 2011-05-23 NOTE — Assessment & Plan Note (Signed)
He describes allergies and PND, would like to see if we can help the cough with loratadine qd

## 2011-06-09 ENCOUNTER — Encounter: Payer: Self-pay | Admitting: Adult Health

## 2011-06-09 ENCOUNTER — Ambulatory Visit (INDEPENDENT_AMBULATORY_CARE_PROVIDER_SITE_OTHER): Payer: Medicaid Other | Admitting: Adult Health

## 2011-06-09 VITALS — BP 124/82 | HR 93 | Temp 99.0°F | Ht 71.0 in | Wt 264.0 lb

## 2011-06-09 DIAGNOSIS — J449 Chronic obstructive pulmonary disease, unspecified: Secondary | ICD-10-CM

## 2011-06-09 MED ORDER — ALBUTEROL SULFATE (2.5 MG/3ML) 0.083% IN NEBU
2.5000 mg | INHALATION_SOLUTION | Freq: Once | RESPIRATORY_TRACT | Status: AC
  Administered 2011-06-09: 2.5 mg via RESPIRATORY_TRACT

## 2011-06-09 NOTE — Progress Notes (Signed)
  Subjective:    Patient ID: John Casey, male    DOB: 28-Oct-1950, 60 y.o.   MRN: [id number]  HPI 60 yo man, hx Hep C, DM, former tobacco 40 pk-yrs, COPD w FEV1 1.61 (46% pred),  05/23/11 OV  Has been on Spiriva and Proventil x 8 months. Was formerly on Advair, stopped due to cough. He is referred for his COPD. He was treated for PNA in 2003 that required MV. He was left with LLL rounded atx that has been stable on serial films. Last CT scan was 05/09/11, showed stable since 10/2008, also stable small pulm nodules. He uses SABA about every other day. He underwent liver biopsy 3 weeks ago to evaluate for cirrhosis. He describes cough, nasal drainage and allergies. Rarely has GERD symptoms. >>rx symbicort   06/09/11 Acute OV Pt returns for persistent cough . Says since starting symbicort 2 weeks ago, cough is worse. Has tickling cough with white mucus at times, wheezing , tightness and chills. No discolored mucus, n/v, or fever. Feels symbicort is making him worse.  No overt reflux .  Currently on Spriiva.     Review of Systems Constitutional:   No  weight loss, night sweats,  Fevers, chills,  +fatigue, or  lassitude.  HEENT:   No headaches,  Difficulty swallowing,  Tooth/dental problems, or  Sore throat,                No sneezing, itching, ear ache, nasal congestion, post nasal drip,   CV:  No chest pain,  Orthopnea, PND, swelling in lower extremities, anasarca, dizziness, palpitations, syncope.   GI  No heartburn, indigestion, abdominal pain, nausea, vomiting, diarrhea, change in bowel habits, loss of appetite, bloody stools.   Resp:  No non-productive cough,    No wheezing.  No chest wall deformity  Skin: no rash or lesions.  GU: no dysuria, change in color of urine, no urgency or frequency.  No flank pain, no hematuria   MS:  No joint pain or swelling.  No decreased range of motion.  No back pain.  Psych:  No change in mood or affect. No depression or anxiety.  No memory  loss.    ;    Objective:   Physical Exam GEN: A/Ox3; pleasant , NAD, obese   HEENT:  [place]/AT,  EACs-clear, TMs-wnl, NOSE-clear, THROAT-clear, no lesions, no postnasal drip or exudate noted.   NECK:  Supple w/ fair ROM; no JVD; normal carotid impulses w/o bruits; no thyromegaly or nodules palpated; no lymphadenopathy.  RESP  Coarse BS w/o, wheezes/ rales/ or rhonchi.no accessory muscle use, no dullness to percussion  CARD:  RRR, no m/r/g  , no peripheral edema, pulses intact, no cyanosis or clubbing.  GI:   Soft & nt; nml bowel sounds; no organomegaly or masses detected.  Musco: Warm bil, no deformities or joint swelling noted.   Neuro: alert, no focal deficits noted.    Skin: Warm, no lesions or rashes         Assessment & Plan:

## 2011-06-09 NOTE — Patient Instructions (Addendum)
Stop Symbicort.  Mucinex DM Twice daily  As needed  Cough.  Continue on claritin daily  Fluids and rest  follow up Dr. Delton Coombes in 2 weeks and As needed   Please contact office for sooner follow up if symptoms do not improve or worsen or seek emergency care

## 2011-06-11 NOTE — Assessment & Plan Note (Signed)
Medication side effects w/ symbicort  Plan:  Stop Symbicort.  Mucinex DM Twice daily  As needed  Cough.  Continue on claritin daily  Fluids and rest  follow up Dr. Delton Coombes in 2 weeks and As needed   Please contact office for sooner follow up if symptoms do not improve or worsen or seek emergency care

## 2011-06-15 ENCOUNTER — Telehealth: Payer: Self-pay | Admitting: Emergency Medicine

## 2011-06-15 NOTE — Telephone Encounter (Signed)
Called and spoke with pt. Informed him no record in his chart where we tried to call him.  And pt states he is feeling better from his visit with TP on 06/09/11.  Therefore nothing further needed.  Will sign off on message.

## 2011-06-23 ENCOUNTER — Ambulatory Visit (INDEPENDENT_AMBULATORY_CARE_PROVIDER_SITE_OTHER): Payer: Medicaid Other | Admitting: Emergency Medicine

## 2011-06-23 ENCOUNTER — Encounter: Payer: Self-pay | Admitting: Emergency Medicine

## 2011-06-23 DIAGNOSIS — J449 Chronic obstructive pulmonary disease, unspecified: Secondary | ICD-10-CM

## 2011-06-23 DIAGNOSIS — R918 Other nonspecific abnormal finding of lung field: Secondary | ICD-10-CM

## 2011-06-23 NOTE — Progress Notes (Signed)
  Subjective:    Patient ID: John Casey, male    DOB: 1951-05-20, 60 y.o.   MRN: [id number]  HPI 60 yo man, hx Hep C, DM, former tobacco 40 pk-yrs, COPD w FEV1 1.61 (46% pred),  05/23/11 OV  Has been on Spiriva and Proventil x 8 months. Was formerly on Advair, stopped due to cough. He is referred for his COPD. He was treated for PNA in 2003 that required MV. He was left with LLL rounded atx that has been stable on serial films. Last CT scan was 05/09/11, showed stable since 10/2008, also stable small pulm nodules. He uses SABA about every other day. He underwent liver biopsy 3 weeks ago to evaluate for cirrhosis. He describes cough, nasal drainage and allergies. Rarely has GERD symptoms. >>rx symbicort   06/09/11 Acute OV Pt returns for persistent cough . Says since starting symbicort 2 weeks ago, cough is worse. Has tickling cough with white mucus at times, wheezing , tightness and chills. No discolored mucus, n/v, or fever. Feels symbicort is making him worse.  No overt reflux .  Currently on Spriiva.    ROV 06/23/11 -- COPD on spiriva. Last time started Symbicort but it made him cough, gave him sore gums so stopped. He thought he had a URI, but currently doing better. Using proventil prn, averages 2x a day.      Objective:   Physical Exam GEN: A/Ox3; pleasant , NAD, obese   HEENT:  [place]/AT,  EACs-clear, TMs-wnl, NOSE-clear, THROAT-clear, no lesions, no postnasal drip or exudate noted.   NECK:  Supple w/ fair ROM; no JVD; normal carotid impulses w/o bruits; no thyromegaly or nodules palpated; no lymphadenopathy.  RESP  Coarse BS w/o, wheezes/ rales/ or rhonchi.no accessory muscle use, no dullness to percussion  CARD:  RRR, no m/r/g  , no peripheral edema, pulses intact, no cyanosis or clubbing.  Musco: Warm bil, no deformities or joint swelling noted.   Neuro: alert, no focal deficits noted.    Skin: Warm, no lesions or rashes    Assessment & Plan:  COPD (chronic obstructive pulmonary  disease) - will continue Spiriva  - Proventil prn - walking oximetry today showed desat.  - repeat PFT - rov 4 months - had the flu shot   Pulmonary nodules No plans for repeat imaging at this time, stable for 2 yrs.

## 2011-06-23 NOTE — Assessment & Plan Note (Addendum)
-   will continue Spiriva  - Proventil prn - walking oximetry today showed desat.  - repeat PFT - rov 4 months - had the flu shot

## 2011-06-23 NOTE — Progress Notes (Signed)
  Subjective:    Patient ID: John Casey, male    DOB: 01-Jul-1951, 60 y.o.   MRN: [id number]  HPI    Review of Systems     Objective:   Physical Exam        Assessment & Plan:  SATURATION QUALIFICATIONS:  Patient Saturations on Room Air at Rest = 94%  Patient Saturations on Room Air while Ambulating = 88%  Patient Saturations on 2 Liters of oxygen while Ambulating = 92%

## 2011-06-23 NOTE — Patient Instructions (Signed)
Please continue Spiriva daily Use proventil as needed Walking oximetry today We will perform full breathing tests at your next visit in 4 months Call sooner if you have any problems

## 2011-06-23 NOTE — Assessment & Plan Note (Signed)
No plans for repeat imaging at this time, stable for 2 yrs.

## 2011-07-10 ENCOUNTER — Ambulatory Visit: Payer: Medicaid Other

## 2011-07-12 ENCOUNTER — Ambulatory Visit: Payer: Medicaid Other | Attending: Family Medicine | Admitting: Rehabilitation

## 2011-07-12 DIAGNOSIS — R293 Abnormal posture: Secondary | ICD-10-CM | POA: Insufficient documentation

## 2011-07-12 DIAGNOSIS — M2550 Pain in unspecified joint: Secondary | ICD-10-CM | POA: Insufficient documentation

## 2011-07-12 DIAGNOSIS — IMO0001 Reserved for inherently not codable concepts without codable children: Secondary | ICD-10-CM | POA: Insufficient documentation

## 2011-07-12 DIAGNOSIS — R5381 Other malaise: Secondary | ICD-10-CM | POA: Insufficient documentation

## 2011-07-12 DIAGNOSIS — M2560 Stiffness of unspecified joint, not elsewhere classified: Secondary | ICD-10-CM | POA: Insufficient documentation

## 2011-07-27 ENCOUNTER — Ambulatory Visit: Payer: Medicaid Other | Admitting: Rehabilitation

## 2011-08-03 ENCOUNTER — Ambulatory Visit: Payer: Medicaid Other | Attending: Rehabilitation | Admitting: Rehabilitation

## 2011-08-03 DIAGNOSIS — M2560 Stiffness of unspecified joint, not elsewhere classified: Secondary | ICD-10-CM | POA: Insufficient documentation

## 2011-08-03 DIAGNOSIS — M2550 Pain in unspecified joint: Secondary | ICD-10-CM | POA: Insufficient documentation

## 2011-08-03 DIAGNOSIS — R5381 Other malaise: Secondary | ICD-10-CM | POA: Insufficient documentation

## 2011-08-03 DIAGNOSIS — IMO0001 Reserved for inherently not codable concepts without codable children: Secondary | ICD-10-CM | POA: Insufficient documentation

## 2011-08-03 DIAGNOSIS — R293 Abnormal posture: Secondary | ICD-10-CM | POA: Insufficient documentation

## 2011-08-08 ENCOUNTER — Encounter: Payer: Medicaid Other | Admitting: Rehabilitation

## 2011-08-21 ENCOUNTER — Telehealth: Payer: Self-pay | Admitting: Emergency Medicine

## 2011-08-21 NOTE — Telephone Encounter (Signed)
The patient says that [facility] brought the wrong portable oxygen to him. He had wanted something that he had seen on T.V. And isn't sure what that oxygen system is called. Ge will get information for RB to review and call us back with this information. In the meantime, he will keep the O2 system that he has from [facility].

## 2011-08-23 ENCOUNTER — Ambulatory Visit: Payer: Medicaid Other | Admitting: Rehabilitation

## 2011-08-30 ENCOUNTER — Encounter: Payer: Medicaid Other | Admitting: Rehabilitation

## 2011-09-07 ENCOUNTER — Ambulatory Visit: Payer: Medicaid Other | Admitting: Rehabilitation

## 2011-10-05 ENCOUNTER — Other Ambulatory Visit (INDEPENDENT_AMBULATORY_CARE_PROVIDER_SITE_OTHER): Payer: Medicaid Other | Admitting: Gastroenterology

## 2011-10-05 DIAGNOSIS — B182 Chronic viral hepatitis C: Secondary | ICD-10-CM

## 2011-10-23 ENCOUNTER — Ambulatory Visit: Payer: Medicaid Other | Admitting: Emergency Medicine

## 2011-11-27 ENCOUNTER — Ambulatory Visit (INDEPENDENT_AMBULATORY_CARE_PROVIDER_SITE_OTHER): Payer: Medicaid Other | Admitting: Emergency Medicine

## 2011-11-27 ENCOUNTER — Encounter: Payer: Self-pay | Admitting: Emergency Medicine

## 2011-11-27 DIAGNOSIS — R05 Cough: Secondary | ICD-10-CM

## 2011-11-27 DIAGNOSIS — R918 Other nonspecific abnormal finding of lung field: Secondary | ICD-10-CM

## 2011-11-27 DIAGNOSIS — J449 Chronic obstructive pulmonary disease, unspecified: Secondary | ICD-10-CM

## 2011-11-27 DIAGNOSIS — G4733 Obstructive sleep apnea (adult) (pediatric): Secondary | ICD-10-CM

## 2011-11-27 NOTE — Patient Instructions (Addendum)
We will continue your Spiriva and oxygen Continue your loratadine (Claritin) every day Start using Qnasl 2 sprays each nostril daily until next visit We will set up a sleep study We will perform full pulmonary function testing at your next visit.  Follow with Dr Delton Coombes in 6 weeks or sooner if you have any trouble.

## 2011-11-27 NOTE — Assessment & Plan Note (Signed)
-   continue the Spiriva and oxygen  - will consider restarting the Symbicort when we get nasal gtt under control, UA may better tolerate.

## 2011-11-27 NOTE — Assessment & Plan Note (Signed)
Suspected - will send him for sleep study.

## 2011-11-27 NOTE — Assessment & Plan Note (Signed)
Stable by CT scan over 2 years.

## 2011-11-27 NOTE — Assessment & Plan Note (Signed)
Add Qnasl to loratadine

## 2011-11-27 NOTE — Progress Notes (Signed)
  Subjective:    Patient ID: John Casey, male    DOB: 11-02-1950, 61 y.o.   MRN: [id number]  HPI 61 yo man, hx Hep C, DM, former tobacco 40 pk-yrs, COPD w FEV1 1.61 (46% pred),  05/23/11 OV  Has been on Spiriva and Proventil x 8 months. Was formerly on Advair, stopped due to cough. He is referred for his COPD. He was treated for PNA in 2003 that required MV. He was left with LLL rounded atx that has been stable on serial films. Last CT scan was 05/09/11, showed stable since 10/2008, also stable small pulm nodules. He uses SABA about every other day. He underwent liver biopsy 3 weeks ago to evaluate for cirrhosis. He describes cough, nasal drainage and allergies. Rarely has GERD symptoms. >>rx symbicort   06/09/11 Acute OV Pt returns for persistent cough . Says since starting symbicort 2 weeks ago, cough is worse. Has tickling cough with white mucus at times, wheezing , tightness and chills. No discolored mucus, n/v, or fever. Feels symbicort is making him worse.  No overt reflux .  Currently on Spriiva.    ROV 06/23/11 -- COPD on spiriva. Last time started Symbicort but it made him cough, gave him sore gums so stopped. He thought he had a URI, but currently doing better. Using proventil prn, averages 2x a day.   ROV 11/27/11 -- COPD on Spiriva, couldn't tolerate Symbicort. Has not yet had repeat  PFT formerly FEV1 1.61 (46% pred).  Tells me that he has been having URI sx since last time. He thinks his Spiriva gives him cough, continues to have PND and runny eyes on loratadine. He has waked up SOB, witnessed apneas, snoring.      Objective:   Physical Exam GEN: A/Ox3; pleasant , NAD, obese   HEENT:  [place]/AT,  EACs-clear, TMs-wnl, NOSE-clear, THROAT-clear, no lesions, no postnasal drip or exudate noted.   NECK:  Supple w/ fair ROM; no JVD; normal carotid impulses w/o bruits; no thyromegaly or nodules palpated; no lymphadenopathy.  RESP  Coarse BS w/o, wheezes/ rales/ or rhonchi.no accessory muscle  use, no dullness to percussion  CARD:  RRR, no m/r/g  , no peripheral edema, pulses intact, no cyanosis or clubbing.  Musco: Warm bil, no deformities or joint swelling noted.   Neuro: alert, no focal deficits noted.    Skin: Warm, no lesions or rashes    Assessment & Plan:  Pulmonary nodules Stable by CT scan over 2 years.   COPD (chronic obstructive pulmonary disease) - continue the Spiriva and oxygen  - will consider restarting the Symbicort when we get nasal gtt under control, UA may better tolerate.   Chronic cough Add Qnasl to loratadine  Sleep apnea, obstructive Suspected - will send him for sleep study.

## 2011-12-17 ENCOUNTER — Ambulatory Visit (HOSPITAL_BASED_OUTPATIENT_CLINIC_OR_DEPARTMENT_OTHER): Payer: Medicaid Other | Attending: Emergency Medicine | Admitting: Radiology

## 2011-12-17 DIAGNOSIS — R0989 Other specified symptoms and signs involving the circulatory and respiratory systems: Secondary | ICD-10-CM | POA: Insufficient documentation

## 2011-12-17 DIAGNOSIS — E119 Type 2 diabetes mellitus without complications: Secondary | ICD-10-CM | POA: Insufficient documentation

## 2011-12-17 DIAGNOSIS — Z79899 Other long term (current) drug therapy: Secondary | ICD-10-CM | POA: Insufficient documentation

## 2011-12-17 DIAGNOSIS — G479 Sleep disorder, unspecified: Secondary | ICD-10-CM | POA: Insufficient documentation

## 2011-12-17 DIAGNOSIS — G4710 Hypersomnia, unspecified: Secondary | ICD-10-CM | POA: Insufficient documentation

## 2011-12-17 DIAGNOSIS — G4733 Obstructive sleep apnea (adult) (pediatric): Secondary | ICD-10-CM

## 2011-12-17 DIAGNOSIS — R0609 Other forms of dyspnea: Secondary | ICD-10-CM | POA: Insufficient documentation

## 2011-12-17 DIAGNOSIS — I10 Essential (primary) hypertension: Secondary | ICD-10-CM | POA: Insufficient documentation

## 2011-12-21 DIAGNOSIS — G4710 Hypersomnia, unspecified: Secondary | ICD-10-CM

## 2011-12-22 ENCOUNTER — Encounter (HOSPITAL_BASED_OUTPATIENT_CLINIC_OR_DEPARTMENT_OTHER): Payer: Medicaid Other

## 2011-12-22 NOTE — Procedures (Signed)
NAMEELVIE, MAINES NO.:  [id number]  MEDICAL RECORD NO.:  [id number]          PATIENT TYPE:  OUT  LOCATION:  SLEEP CENTER                 FACILITY:  [facility]  PHYSICIAN:  Coralyn Helling, MD        DATE OF BIRTH:  17-Dec-1950  DATE OF STUDY:  12/17/2011                           NOCTURNAL POLYSOMNOGRAM  REFERRING PHYSICIAN:  Leslye Peer, MD  FACILITY:  [facility].  REFERRING PHYSICIAN:  Leslye Peer, MD  INDICATION:  Mr. Aguinaldo is a 61 year old male who has a history of hypertension and diabetes.  He also has snoring, sleep disruption, witnessed apnea and daytime sleepiness.  He is referred to the sleep lab for evaluation of hypersomnia with obstructive sleep apnea.  Height is 5 feet 11 inches, weight is 263 pounds.  BMI is 37, neck size is 17 inches.  MEDICATIONS:  Gabapentin, Que Nasal, metformin, Spiriva, glipizide, naproxen, and clonidine.  EPWORTH SCORE:  10.  SLEEP ARCHITECTURE:  Total recording time was 418 minutes.  Total sleep time was 36 minutes.  Sleep efficiency was 80%.  Sleep latency was 34 minutes.  The study was notable for the lack of slow-wave sleep and REM sleep.  The patient slept exclusively in the nonsupine position.  RESPIRATORY DATA:  The average respiratory rate was 18.  Moderate snoring was noted by the technician.  The apnea/hypopnea index was 1.7. The events were exclusively obstructive in nature.  OXYGEN DATA:  The baseline oxygenation was 96%.  The oxygen saturation nadir was 87%.  The patient spent a total of 6.4 minutes with an oxygen saturation below 88%.  The study was conducted without the use of supplemental oxygen.  CARDIAC DATA:  The average heart rate was 85 and the rhythm strip showed sinus rhythm with occasional PVCs.  MOVEMENT PARASOMNIA:  The patient had 2 restroom trips and the periodic limb movement index was 0.  IMPRESSION:  There is insufficient sleep time to determine if the patient has  sleep-disordered breathing.  It did, however, appear that he had difficulties with sleep initiation, sleep maintenance related to respiratory events.  If there is a strong clinical suspicion for sleep-disordered breathing, then I recommend that the patient return to the sleep lab for a repeat in lab sleep study, possibly with the use of a sleep aid.     Coralyn Helling, MD Diplomat, American Board of Sleep Medicine    VS/MEDQ  D:  12/21/2011 14:04:27  T:  12/22/2011 02:03:47  Job:  [id number]

## 2012-01-05 ENCOUNTER — Other Ambulatory Visit: Payer: Self-pay | Admitting: Family Medicine

## 2012-01-15 ENCOUNTER — Ambulatory Visit (INDEPENDENT_AMBULATORY_CARE_PROVIDER_SITE_OTHER): Payer: Medicare Other | Admitting: Emergency Medicine

## 2012-01-15 ENCOUNTER — Encounter: Payer: Self-pay | Admitting: Emergency Medicine

## 2012-01-15 VITALS — BP 136/74 | HR 92 | Temp 98.4°F | Ht 71.0 in | Wt 253.0 lb

## 2012-01-15 DIAGNOSIS — R918 Other nonspecific abnormal finding of lung field: Secondary | ICD-10-CM

## 2012-01-15 DIAGNOSIS — G4733 Obstructive sleep apnea (adult) (pediatric): Secondary | ICD-10-CM

## 2012-01-15 DIAGNOSIS — J449 Chronic obstructive pulmonary disease, unspecified: Secondary | ICD-10-CM

## 2012-01-15 DIAGNOSIS — R05 Cough: Secondary | ICD-10-CM

## 2012-01-15 LAB — PULMONARY FUNCTION TEST

## 2012-01-15 MED ORDER — BECLOMETHASONE DIPROPIONATE 80 MCG/ACT NA AERS: 2.0000 | INHALATION_SPRAY | Freq: Every day | NASAL | Status: DC

## 2012-01-15 NOTE — Assessment & Plan Note (Signed)
FEV1 stable by PFT 01/15/12, positive BD response. Would like to restart Symbicort but will wait until his throat and UA symptoms are improved.  - encouraged him to wear O2 with all exertion - Spiriva foir now, consider restart Symbicort when the UA issues are better -

## 2012-01-15 NOTE — Patient Instructions (Signed)
Please continue your Spiriva every day You need to wear your oxygen with all exertion Continue your loratadine and Qnasl as you are taking them We may decide to retry Symbicort in the future We may decide to repeat your sleep study with a sleep aid. We can revisit this next time Follow with Dr Delton Coombes in 3 months or sooner if you have any problems.

## 2012-01-15 NOTE — Assessment & Plan Note (Signed)
Slightly better since addition of Qnasl, but still has mucous from nose and dry cough.

## 2012-01-15 NOTE — Assessment & Plan Note (Addendum)
PSG 12/17/11 was inadequate to tell whether he had true OSA, poor sleep initiation. Recommended repeat PSG with a sleep aid.  - will defer for now, revisit next time at his request

## 2012-01-15 NOTE — Progress Notes (Signed)
  Subjective:    Patient ID: John Casey, male    DOB: [birth year], 61 y.o.   MRN: [id number]  HPI 61 yo man, hx Hep C, DM, former tobacco 40 pk-yrs, COPD w FEV1 1.61 (46% pred),  05/23/11 OV  Has been on Spiriva and Proventil x 8 months. Was formerly on Advair, stopped due to cough. He is referred for his COPD. He was treated for PNA in 2003 that required MV. He was left with LLL rounded atx that has been stable on serial films. Last CT scan was 05/09/11, showed stable since 10/2008, also stable small pulm nodules. He uses SABA about every other day. He underwent liver biopsy 3 weeks ago to evaluate for cirrhosis. He describes cough, nasal drainage and allergies. Rarely has GERD symptoms. >>rx symbicort   06/09/11 Acute OV Pt returns for persistent cough . Says since starting symbicort 2 weeks ago, cough is worse. Has tickling cough with white mucus at times, wheezing , tightness and chills. No discolored mucus, n/v, or fever. Feels symbicort is making him worse.  No overt reflux .  Currently on Spriiva.    ROV 06/23/11 -- COPD on spiriva. Last time started Symbicort but it made him cough, gave him sore gums so stopped. He thought he had a URI, but currently doing better. Using proventil prn, averages 2x a day.   ROV 11/27/11 -- COPD on Spiriva, couldn't tolerate Symbicort. Has not yet had repeat  PFT formerly FEV1 1.61 (46% pred).  Tells me that he has been having URI sx since last time. He thinks his Spiriva gives him cough, continues to have PND and runny eyes on loratadine. He has waked up SOB, witnessed apneas, snoring.   ROV 01/15/12 -- COPD, allergies and cough, ? OSA. Had PSG done 3/25, repeat PFT today. FEV1 1.56, stable compared with 1.61 in 04/2009. Added Qnasl last time and it is helpful. Still has cough and throat congestion but better on the Qnasl.      Objective:   Physical Exam GEN: A/Ox3; pleasant , NAD, obese   HEENT:  /AT,  EACs-clear, TMs-wnl, NOSE-clear, THROAT-clear, no  lesions, no postnasal drip or exudate noted.   NECK:  Supple w/ fair ROM; no JVD; normal carotid impulses w/o bruits; no thyromegaly or nodules palpated; no lymphadenopathy.  RESP  Coarse BS w/o, wheezes/ rales/ or rhonchi.no accessory muscle use, no dullness to percussion  CARD:  RRR, no m/r/g  , no peripheral edema, pulses intact, no cyanosis or clubbing.  Musco: Warm bil, no deformities or joint swelling noted.   Neuro: alert, no focal deficits noted.    Skin: Warm, no lesions or rashes    Assessment & Plan:  Chronic cough Slightly better since addition of Qnasl, but still has mucous from nose and dry cough.   COPD (chronic obstructive pulmonary disease) FEV1 stable by PFT 01/15/12, positive BD response. Would like to restart Symbicort but will wait until his throat and UA symptoms are improved.  - encouraged him to wear O2 with all exertion - Spiriva foir now, consider restart Symbicort when the UA issues are better -   Sleep apnea, obstructive PSG 12/17/11 was inadequate to tell whether he had true OSA, poor sleep initiation. Recommended repeat PSG with a sleep aid.  - will defer for now, revisit next time at his request

## 2012-01-15 NOTE — Progress Notes (Signed)
PFT done today. 

## 2012-01-23 ENCOUNTER — Encounter: Payer: Self-pay | Admitting: Emergency Medicine

## 2012-01-28 IMAGING — CR DG LUMBAR SPINE COMPLETE 4+V
5 series · 5 of 5 positions shown · non-contrast
Comparison: None.

CLINICAL DATA: Low back pain for years, some radiation down the
left leg, no trauma

LUMBAR SPINE - COMPLETE 4+ VIEW

[t l-spine a.p.]
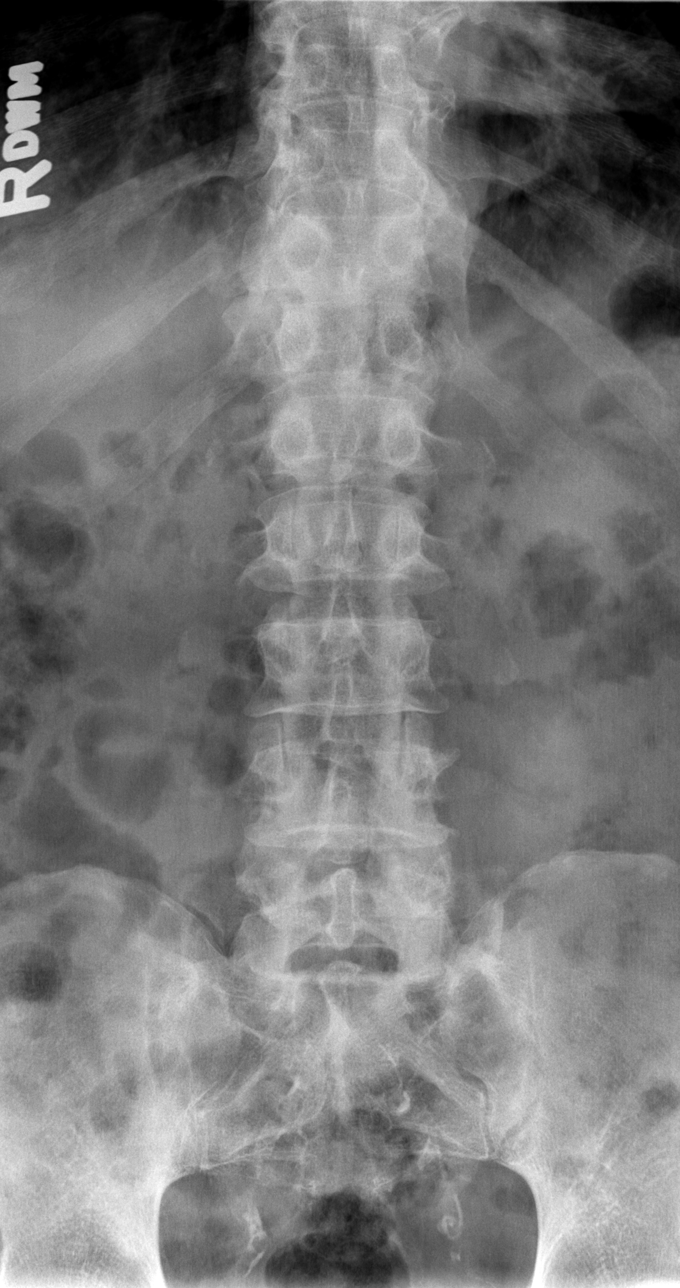

[t l-spine oblique exposure (1 of 2)]
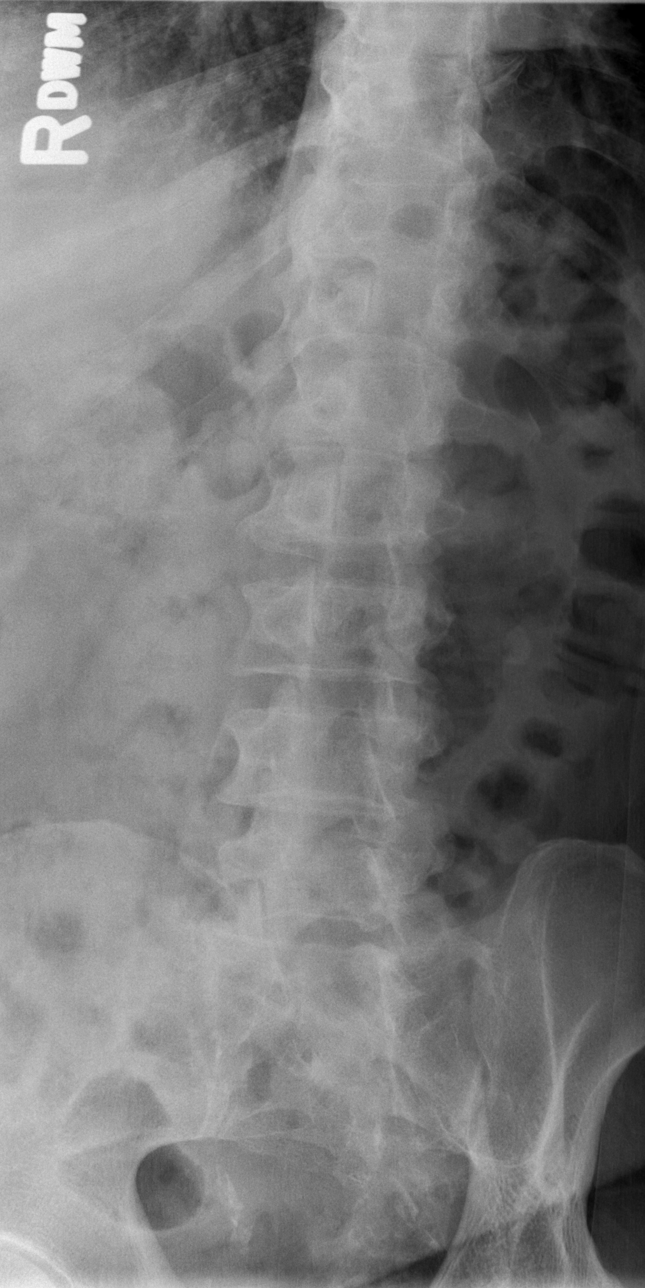

[t l-spine oblique exposure (2 of 2)]
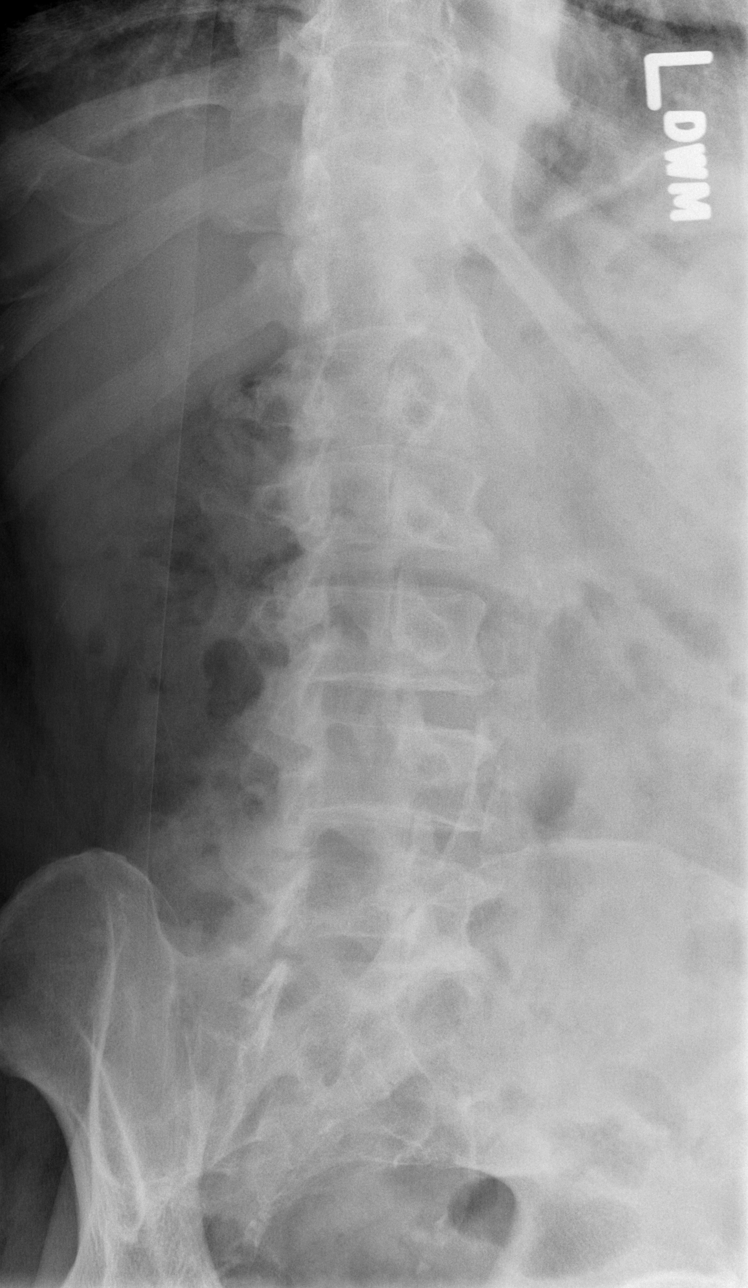

[t l-spine lat]
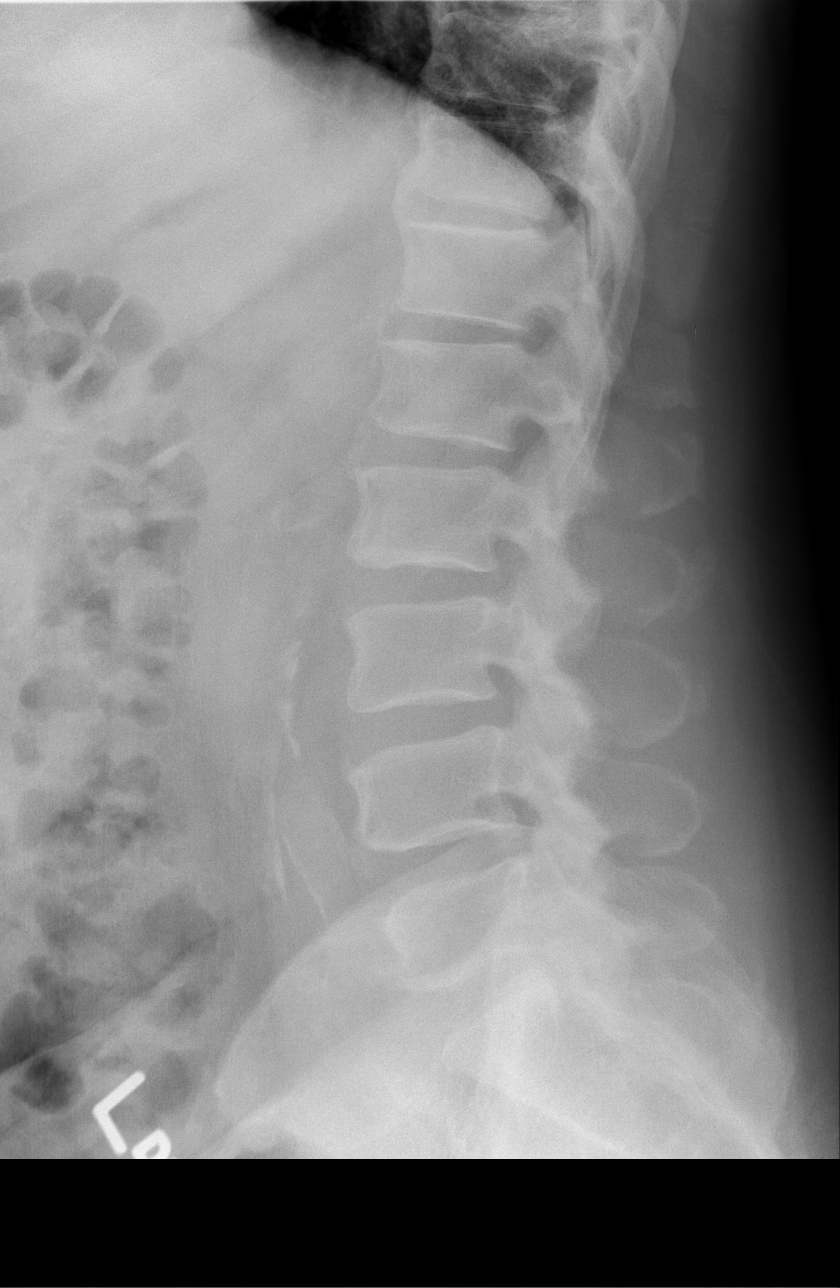

[t l-spine l5-s1 spot]
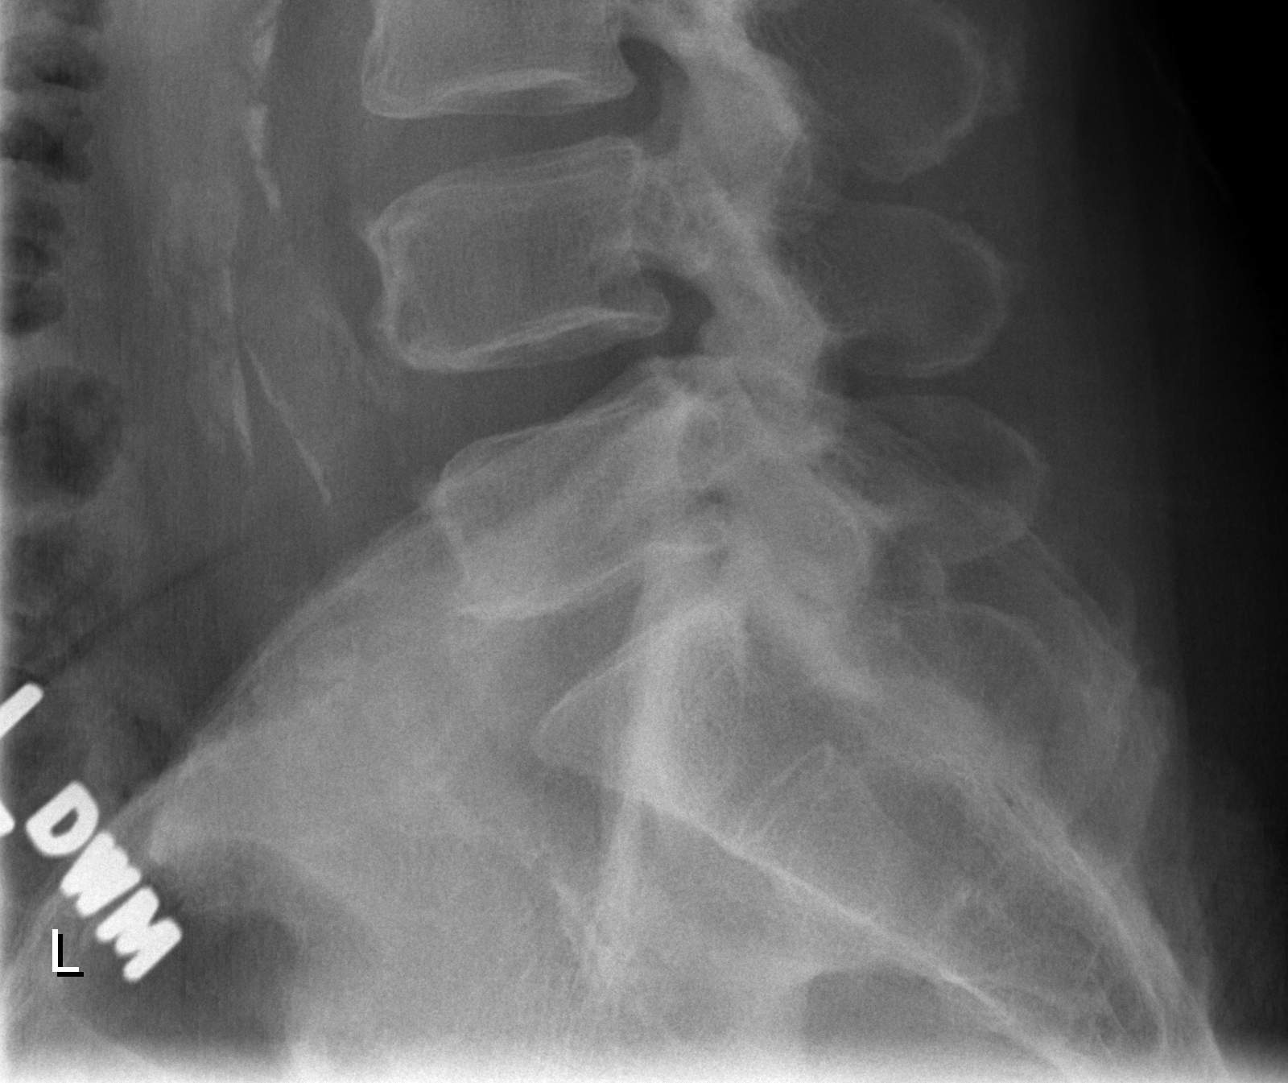

[5 of 5 positions shown; findings below may reference images not displayed]

FINDINGS: The lumbar vertebrae are in normal alignment with normal
intervertebral disc spaces.  No compression deformity is seen.
Mild anterior osteophyte formation is present.  The SI joints
appear normal.
IMPRESSION: Normal alignment.  Normal disc spaces.  Mild degenerative change.

## 2012-02-27 ENCOUNTER — Other Ambulatory Visit: Payer: Self-pay | Admitting: Physician Assistant

## 2012-03-06 ENCOUNTER — Telehealth: Payer: Self-pay | Admitting: Emergency Medicine

## 2012-03-06 NOTE — Telephone Encounter (Signed)
Please notify patient that due to [facility] Preferred Drug list we need to change his Qnasl to fluticasone nasal spray. If he agrees then send script for fluticasone, 2 sprays each nostril bid + 11 refills. Thanks.

## 2012-03-06 NOTE — Telephone Encounter (Signed)
[email or web address] to discuss med change per RB.

## 2012-03-06 NOTE — Telephone Encounter (Signed)
Pt returned call. Kathleen W Perdue  

## 2012-03-06 NOTE — Telephone Encounter (Signed)
Called pt's home and cell #'s - [email or web address] 

## 2012-03-07 MED ORDER — FLUTICASONE PROPIONATE 50 MCG/ACT NA SUSP: 2.0000 | Freq: Two times a day (BID) | NASAL | Status: DC

## 2012-03-07 NOTE — Telephone Encounter (Signed)
Pt returned triage's call & can be reached at [phone].  John Casey

## 2012-03-07 NOTE — Telephone Encounter (Signed)
Pt aware and rx sent. Deniss Wormley, CMA  

## 2012-03-11 ENCOUNTER — Other Ambulatory Visit (HOSPITAL_COMMUNITY): Payer: Self-pay | Admitting: Family Medicine

## 2012-03-14 ENCOUNTER — Ambulatory Visit (HOSPITAL_COMMUNITY)
Admission: RE | Admit: 2012-03-14 | Discharge: 2012-03-14 | Disposition: A | Discharge status: Home / Self Care | Payer: Medicaid Other | Source: Ambulatory Visit | Attending: Family Medicine | Admitting: Family Medicine

## 2012-03-14 DIAGNOSIS — R229 Localized swelling, mass and lump, unspecified: Secondary | ICD-10-CM | POA: Insufficient documentation

## 2012-04-18 ENCOUNTER — Ambulatory Visit: Payer: Medicaid Other | Admitting: Emergency Medicine

## 2012-05-20 ENCOUNTER — Encounter: Payer: Self-pay | Admitting: Emergency Medicine

## 2012-05-20 ENCOUNTER — Ambulatory Visit (INDEPENDENT_AMBULATORY_CARE_PROVIDER_SITE_OTHER): Payer: Medicare Other | Admitting: Emergency Medicine

## 2012-05-20 VITALS — BP 132/82 | HR 104 | Temp 98.9°F | Ht 71.0 in | Wt 249.2 lb

## 2012-05-20 DIAGNOSIS — G4733 Obstructive sleep apnea (adult) (pediatric): Secondary | ICD-10-CM

## 2012-05-20 DIAGNOSIS — J449 Chronic obstructive pulmonary disease, unspecified: Secondary | ICD-10-CM

## 2012-05-20 DIAGNOSIS — R05 Cough: Secondary | ICD-10-CM

## 2012-05-20 MED ORDER — ZOLPIDEM TARTRATE 10 MG PO TABS: 10.0000 mg | ORAL_TABLET | Freq: Every evening | ORAL | Status: DC | PRN

## 2012-05-20 NOTE — Progress Notes (Signed)
Subjective:    Patient ID: John Casey, male    DOB: 09-Jun-1951, 61 y.o.   MRN: [id number] HPI 61 yo man, hx Hep C, DM, former tobacco 40 pk-yrs, COPD w FEV1 1.61 (46% pred),  05/23/11 OV  Has been on Spiriva and Proventil x 8 months. Was formerly on Advair, stopped due to cough. He is referred for his COPD. He was treated for PNA in 2003 that required MV. He was left with LLL rounded atx that has been stable on serial films. Last CT scan was 05/09/11, showed stable since 10/2008, also stable small pulm nodules. He uses SABA about every other day. He underwent liver biopsy 3 weeks ago to evaluate for cirrhosis. He describes cough, nasal drainage and allergies. Rarely has GERD symptoms. >>rx symbicort   06/09/11 Acute OV Pt returns for persistent cough . Says since starting symbicort 2 weeks ago, cough is worse. Has tickling cough with white mucus at times, wheezing , tightness and chills. No discolored mucus, n/v, or fever. Feels symbicort is making him worse.  No overt reflux .  Currently on Spriiva.    ROV 06/23/11 -- COPD on spiriva. Last time started Symbicort but it made him cough, gave him sore gums so stopped. He thought he had a URI, but currently doing better. Using proventil prn, averages 2x a day.   ROV 11/27/11 -- COPD on Spiriva, couldn't tolerate Symbicort. Has not yet had repeat  PFT formerly FEV1 1.61 (46% pred).  Tells me that he has been having URI sx since last time. He thinks his Spiriva gives him cough, continues to have PND and runny eyes on loratadine. He has waked up SOB, witnessed apneas, snoring.   ROV 01/15/12 -- COPD, allergies and cough, ? OSA. Had PSG done 3/25, repeat PFT today. FEV1 1.56, stable compared with 1.61 in 04/2009. Added Qnasl last time and it is helpful. Still has cough and throat congestion but better on the Qnasl.   ROV 05/20/12 -- COPD, allergies and cough. Also ? OSA dx with equivocal PSG in March '13. We changed Qnasl to fluticasone due to insurance; also  changed proventil to ProAir >> he had tongue swelling with the proair, had to stop it. He was changed back to proventil by [facility]. He prefers the Qnasl to the fluticasone because of better delivery. Remains on Spiriva. Remains on O2 2L/min. He is willing to return to the sleep lab with an ambien to help get a better study.      Objective:   Physical Exam Filed Vitals:   05/20/12 1458  BP: 132/82  Pulse: 104  Temp: 98.9 F (37.2 C)   GEN: A/Ox3; pleasant , NAD, obese   HEENT:  [place]/AT,  EACs-clear, TMs-wnl, NOSE-clear, THROAT-clear, no lesions, no postnasal drip or exudate noted.   NECK:  Supple w/ fair ROM; no JVD; normal carotid impulses w/o bruits; no thyromegaly or nodules palpated; no lymphadenopathy.  RESP  Coarse BS w/o, wheezes/ rales/ or rhonchi.no accessory muscle use, no dullness to percussion  CARD:  RRR, no m/r/g  , no peripheral edema, pulses intact, no cyanosis or clubbing.  Musco: Warm bil, no deformities or joint swelling noted.   Neuro: alert, no focal deficits noted.    Skin: Warm, no lesions or rashes    Assessment & Plan:  COPD (chronic obstructive pulmonary disease) Continue spiriva  Continue o2 He needs proventil, although proAir is formulary - proair has caused tongue swelling  Sleep apnea, obstructive He is willing to  go back for repeat sleep study with a sleep aid  Chronic cough Unable to tolerate fluticasone, wants to go back to qnasl

## 2012-05-20 NOTE — Patient Instructions (Addendum)
Restart Qnasl 2 sprays each nostril daily Do not use ProAir Restart proventil 2 puffs if needed for shortness of breath Continue your spiriva and oxygen as you are taking them Follow with Dr Delton Coombes in 3 months or sooner if you have any problems.

## 2012-05-20 NOTE — Assessment & Plan Note (Signed)
Unable to tolerate fluticasone, wants to go back to qnasl

## 2012-05-20 NOTE — Assessment & Plan Note (Signed)
He is willing to go back for repeat sleep study with a sleep aid

## 2012-05-20 NOTE — Assessment & Plan Note (Signed)
Continue spiriva  Continue o2 He needs proventil, although proAir is formulary - proair has caused tongue swelling

## 2012-05-22 ENCOUNTER — Other Ambulatory Visit (HOSPITAL_COMMUNITY)
Admission: RE | Admit: 2012-05-22 | Discharge: 2012-05-22 | Disposition: A | Discharge status: Home / Self Care | Payer: Medicare Other | Source: Ambulatory Visit | Attending: Otolaryngology | Admitting: Otolaryngology

## 2012-05-22 DIAGNOSIS — R220 Localized swelling, mass and lump, head: Secondary | ICD-10-CM | POA: Insufficient documentation

## 2012-06-28 ENCOUNTER — Encounter (HOSPITAL_BASED_OUTPATIENT_CLINIC_OR_DEPARTMENT_OTHER): Payer: Medicare Other

## 2012-08-26 ENCOUNTER — Ambulatory Visit: Payer: Medicare Other | Admitting: Emergency Medicine

## 2012-09-23 ENCOUNTER — Encounter: Payer: Self-pay | Admitting: Emergency Medicine

## 2012-09-23 ENCOUNTER — Ambulatory Visit (INDEPENDENT_AMBULATORY_CARE_PROVIDER_SITE_OTHER): Payer: Medicare Other | Admitting: Emergency Medicine

## 2012-09-23 VITALS — BP 138/80 | HR 105 | Temp 98.0°F | Ht 71.0 in | Wt 257.0 lb

## 2012-09-23 DIAGNOSIS — Z23 Encounter for immunization: Secondary | ICD-10-CM

## 2012-09-23 DIAGNOSIS — G4733 Obstructive sleep apnea (adult) (pediatric): Secondary | ICD-10-CM

## 2012-09-23 DIAGNOSIS — J449 Chronic obstructive pulmonary disease, unspecified: Secondary | ICD-10-CM

## 2012-09-23 NOTE — Progress Notes (Signed)
Subjective:    Patient ID: John Casey, male    DOB: 16-Jun-1951, 61 y.o.   MRN: [id number] HPI 61 yo man, hx Hep C, DM, former tobacco 40 pk-yrs, COPD w FEV1 1.61 (46% pred),  05/23/11 OV  Has been on Spiriva and Proventil x 8 months. Was formerly on Advair, stopped due to cough. He is referred for his COPD. He was treated for PNA in 2003 that required MV. He was left with LLL rounded atx that has been stable on serial films. Last CT scan was 05/09/11, showed stable since 10/2008, also stable small pulm nodules. He uses SABA about every other day. He underwent liver biopsy 3 weeks ago to evaluate for cirrhosis. He describes cough, nasal drainage and allergies. Rarely has GERD symptoms. >>rx symbicort   06/09/11 Acute OV Pt returns for persistent cough . Says since starting symbicort 2 weeks ago, cough is worse. Has tickling cough with white mucus at times, wheezing , tightness and chills. No discolored mucus, n/v, or fever. Feels symbicort is making him worse.  No overt reflux .  Currently on Spriiva.    ROV 06/23/11 -- COPD on spiriva. Last time started Symbicort but it made him cough, gave him sore gums so stopped. He thought he had a URI, but currently doing better. Using proventil prn, averages 2x a day.   ROV 11/27/11 -- COPD on Spiriva, couldn't tolerate Symbicort. Has not yet had repeat  PFT formerly FEV1 1.61 (46% pred).  Tells me that he has been having URI sx since last time. He thinks his Spiriva gives him cough, continues to have PND and runny eyes on loratadine. He has waked up SOB, witnessed apneas, snoring.   ROV 01/15/12 -- COPD, allergies and cough, ? OSA. Had PSG done 3/25, repeat PFT today. FEV1 1.56, stable compared with 1.61 in 04/2009. Added Qnasl last time and it is helpful. Still has cough and throat congestion but better on the Qnasl.   ROV 05/20/12 -- COPD, allergies and cough. Also ? OSA dx with equivocal PSG in March '13. We changed Qnasl to fluticasone due to insurance; also  changed proventil to ProAir >> he had tongue swelling with the proair, had to stop it. He was changed back to proventil by [facility]. He prefers the Qnasl to the fluticasone because of better delivery. Remains on Spiriva. Remains on O2 2L/min. He is willing to return to the sleep lab with an ambien to help get a better study.   ROV 09/23/12 -- COPD, allergies and cough. Also ? OSA dx with equivocal PSG in March '13. Since last time he has stopped Spiriva. He feels that he has done OK off of it. He continues to use O2 w exertion. His exertional tolerance is better. He has lost 20 lbs since last time. He did not have the repeat PSG (w Remus Loffler).      Objective:   Physical Exam Filed Vitals:   09/23/12 1632  BP: 138/80  Pulse: 105  Temp: 98 F (36.7 C)   GEN: A/Ox3; pleasant , NAD, obese   HEENT:  [place]/AT,  EACs-clear, TMs-wnl, NOSE-clear, THROAT-clear, no lesions, no postnasal drip or exudate noted.   NECK:  Supple w/ fair ROM; no JVD; normal carotid impulses w/o bruits; no thyromegaly or nodules palpated; no lymphadenopathy.  RESP  Coarse BS w/o, wheezes/ rales/ or rhonchi.no accessory muscle use, no dullness to percussion  CARD:  RRR, no m/r/g  , no peripheral edema, pulses intact, no cyanosis or clubbing.  Musco: Warm bil, no deformities or joint swelling noted.   Neuro: alert, no focal deficits noted.    Skin: Warm, no lesions or rashes    Assessment & Plan:  Sleep apnea, obstructive Encouraged him to let me schedule repeat PSG with ambien to enhance the study. He will let me know when he is ready to do it  COPD (chronic obstructive pulmonary disease) He is off Spiriva.  - consider restart Spiriva in future - albuterol prn - O2 w exertion

## 2012-09-23 NOTE — Patient Instructions (Addendum)
Please continue your albuterol as needed We could consider restarting Spiriva once a day. Please call us if you decide to restart it Use your oxygen with exertion.  We can reschedule your sleep study if you want to do so. You may benefit from this give our suspicion for sleep apnea.  Follow with Dr Delton Coombes in 4 months or sooner if you have any problems.

## 2012-09-23 NOTE — Assessment & Plan Note (Signed)
Encouraged him to let me schedule repeat PSG with ambien to enhance the study. He will let me know when he is ready to do it

## 2012-09-23 NOTE — Assessment & Plan Note (Signed)
He is off Spiriva.  - consider restart Spiriva in future - albuterol prn - O2 w exertion

## 2013-02-11 ENCOUNTER — Ambulatory Visit (INDEPENDENT_AMBULATORY_CARE_PROVIDER_SITE_OTHER): Payer: Medicare Other | Admitting: Emergency Medicine

## 2013-02-11 ENCOUNTER — Encounter: Payer: Self-pay | Admitting: Emergency Medicine

## 2013-02-11 VITALS — BP 148/68 | HR 99 | Temp 98.7°F | Ht 71.0 in | Wt 256.6 lb

## 2013-02-11 DIAGNOSIS — J449 Chronic obstructive pulmonary disease, unspecified: Secondary | ICD-10-CM

## 2013-02-11 NOTE — Patient Instructions (Addendum)
You need to stop smoking. This is the most important thing you can do for your health!! Please continue to use albuterol (Proventil) as needed for shortness of breath You may benefit from an every-day inhaler as an alternative to Spiriva (since Spiriva was not helpful). Please let us know if you would like to try this You may benefit from a repeat sleep study, especially since you are suffering from fatigue and weakness.  Follow with Dr Delton Coombes in 4 months or sooner if you have any problems.

## 2013-02-11 NOTE — Assessment & Plan Note (Signed)
He didn't benefit, in fact thought he declined, on Spiriva. Discussed with him that there are alternative long-acting BD's. He doesn't want to add anything right now. I suspect he would benefit, but his overall dyspnea is multifactorial - it wouldn't fit everything (obesity, deconditioning, allergies and head congestion, etc...) - encouraged him to stop smoking - SABA prn - will start long-acting meds (an alternative to Spiriva) when he is ready to do so.

## 2013-02-11 NOTE — Progress Notes (Signed)
Subjective:    Patient ID: John Casey, male    DOB: [birth year], 61 y.o.   MRN: [id number] HPI 62 yo man, hx Hep C, DM, former tobacco 40 pk-yrs, COPD w FEV1 1.61 (46% pred),  05/23/11 OV  Has been on Spiriva and Proventil x 8 months. Was formerly on Advair, stopped due to cough. He is referred for his COPD. He was treated for PNA in 2003 that required MV. He was left with LLL rounded atx that has been stable on serial films. Last CT scan was 05/09/11, showed stable since 10/2008, also stable small pulm nodules. He uses SABA about every other day. He underwent liver biopsy 3 weeks ago to evaluate for cirrhosis. He describes cough, nasal drainage and allergies. Rarely has GERD symptoms. >>rx symbicort   06/09/11 Acute OV Pt returns for persistent cough . Says since starting symbicort 2 weeks ago, cough is worse. Has tickling cough with white mucus at times, wheezing , tightness and chills. No discolored mucus, n/v, or fever. Feels symbicort is making him worse.  No overt reflux .  Currently on Spriiva.    ROV 06/23/11 -- COPD on spiriva. Last time started Symbicort but it made him cough, gave him sore gums so stopped. He thought he had a URI, but currently doing better. Using proventil prn, averages 2x a day.   ROV 11/27/11 -- COPD on Spiriva, couldn't tolerate Symbicort. Has not yet had repeat  PFT formerly FEV1 1.61 (46% pred).  Tells me that he has been having URI sx since last time. He thinks his Spiriva gives him cough, continues to have PND and runny eyes on loratadine. He has waked up SOB, witnessed apneas, snoring.   ROV 01/15/12 -- COPD, allergies and cough, ? OSA. Had PSG done 3/25, repeat PFT today. FEV1 1.56, stable compared with 1.61 in 04/2009. Added Qnasl last time and it is helpful. Still has cough and throat congestion but better on the Qnasl.   ROV 05/20/12 -- COPD, allergies and cough. Also ? OSA dx with equivocal PSG in March '13. We changed Qnasl to fluticasone due to insurance; also  changed proventil to ProAir >> he had tongue swelling with the proair, had to stop it. He was changed back to proventil by [facility]. He prefers the Qnasl to the fluticasone because of better delivery. Remains on Spiriva. Remains on O2 2L/min. He is willing to return to the sleep lab with an ambien to help get a better study.   ROV 09/23/12 -- COPD, allergies and cough. Also ? OSA dx with equivocal PSG in March '13. Since last time he has stopped Spiriva. He feels that he has done OK off of it. He continues to use O2 w exertion. His exertional tolerance is better. He has lost 20 lbs since last time. He did not have the repeat PSG (w Remus Loffler).   ROV 02/11/13 -- COPD, allergies and chronic cough. Uses O2 w exertion, set on 2L/min. Had an equivocal PSG, still suspicion for OSA. Not currently on BD exertion. He is smoking, restarted in Feb or March.  On zyrtec, not on a nasal steroid right now. He is using albuterol prn, averages once a day, helps him clear mucous. We've tried long-acting BD's, but he has always believed that it makes him feel worse.      Objective:   Physical Exam Filed Vitals:   02/11/13 0926  BP: 148/68  Pulse: 99  Temp: 98.7 F (37.1 C)   GEN: A/Ox3; pleasant ,  NAD, obese   HEENT:  [place]/AT,  EACs-clear, TMs-wnl, NOSE-clear, THROAT-clear, no lesions, no postnasal drip or exudate noted.   NECK:  Supple w/ fair ROM; no JVD; normal carotid impulses w/o bruits; no thyromegaly or nodules palpated; no lymphadenopathy.  RESP  Coarse BS w/o, wheezes/ rales/ or rhonchi.no accessory muscle use, no dullness to percussion  CARD:  RRR, no m/r/g  , no peripheral edema, pulses intact, no cyanosis or clubbing.  Musco: Warm bil, no deformities or joint swelling noted.   Neuro: alert, no focal deficits noted.    Skin: Warm, no lesions or rashes    Assessment & Plan:  COPD (chronic obstructive pulmonary disease) He didn't benefit, in fact thought he declined, on Spiriva. Discussed with  him that there are alternative long-acting BD's. He doesn't want to add anything right now. I suspect he would benefit, but his overall dyspnea is multifactorial - it wouldn't fit everything (obesity, deconditioning, allergies and head congestion, etc...) - encouraged him to stop smoking - SABA prn - will start long-acting meds (an alternative to Spiriva) when he is ready to do so.

## 2013-02-24 ENCOUNTER — Telehealth: Payer: Self-pay | Admitting: Emergency Medicine

## 2013-02-24 DIAGNOSIS — R05 Cough: Secondary | ICD-10-CM

## 2013-02-24 MED ORDER — BECLOMETHASONE DIPROPIONATE 80 MCG/ACT NA AERS: 2.0000 | INHALATION_SPRAY | Freq: Every day | NASAL | Status: DC

## 2013-02-24 NOTE — Telephone Encounter (Signed)
Spoke with pt He is asking for refill on Qnasal  Rx was sent to pharm Pt states prefers this over flonase Nothing further needed per pt

## 2013-06-18 ENCOUNTER — Ambulatory Visit (INDEPENDENT_AMBULATORY_CARE_PROVIDER_SITE_OTHER): Payer: Medicare Other | Admitting: Emergency Medicine

## 2013-06-18 ENCOUNTER — Encounter: Payer: Self-pay | Admitting: Emergency Medicine

## 2013-06-18 VITALS — BP 140/88 | HR 120 | Temp 99.3°F | Ht 71.0 in | Wt 263.0 lb

## 2013-06-18 DIAGNOSIS — R053 Chronic cough: Secondary | ICD-10-CM

## 2013-06-18 DIAGNOSIS — J449 Chronic obstructive pulmonary disease, unspecified: Secondary | ICD-10-CM

## 2013-06-18 DIAGNOSIS — Z23 Encounter for immunization: Secondary | ICD-10-CM

## 2013-06-18 DIAGNOSIS — R05 Cough: Secondary | ICD-10-CM

## 2013-06-18 NOTE — Patient Instructions (Addendum)
Keep using your nicotine lozenges to cut down your cigarettes Call our office when you are ready to set your quit date. We will use an additional medication to help you stop Use albuterol 2 puffs as needed Continue your zyrtec (cetirizine) every day Start fluticasone nasal spray 2 sprays each side twice a day Try using decongestants over the counter that contain chlorpheniramine as directed Follow with Dr Delton Coombes in 3 months or sooner if you have any problems.

## 2013-06-18 NOTE — Assessment & Plan Note (Signed)
He is not currently on spiriva, uses albuterol prn. He minimizes his dyspnea, biggest complaint is his drainage and cough. He is interested in smoking cessation and we spent most of our time today discussing this.  - continue albuterol prn - flu shot today - discussed smoking cessation - will need to be on a scheduled medication again at sometime in the future.  - rov 3

## 2013-06-18 NOTE — Progress Notes (Signed)
Subjective:    Patient ID: John Casey, male    DOB: 08-31-1951, 62 y.o.   MRN: [id number] HPI 62 yo man, hx Hep C, DM, former tobacco 40 pk-yrs, COPD w FEV1 1.61 (46% pred),  05/23/11 OV  Has been on Spiriva and Proventil x 8 months. Was formerly on Advair, stopped due to cough. He is referred for his COPD. He was treated for PNA in 2003 that required MV. He was left with LLL rounded atx that has been stable on serial films. Last CT scan was 05/09/11, showed stable since 10/2008, also stable small pulm nodules. He uses SABA about every other day. He underwent liver biopsy 3 weeks ago to evaluate for cirrhosis. He describes cough, nasal drainage and allergies. Rarely has GERD symptoms. >>rx symbicort   06/09/11 Acute OV Pt returns for persistent cough . Says since starting symbicort 2 weeks ago, cough is worse. Has tickling cough with white mucus at times, wheezing , tightness and chills. No discolored mucus, n/v, or fever. Feels symbicort is making him worse.  No overt reflux .  Currently on Spriiva.    ROV 06/23/11 -- COPD on spiriva. Last time started Symbicort but it made him cough, gave him sore gums so stopped. He thought he had a URI, but currently doing better. Using proventil prn, averages 2x a day.   ROV 11/27/11 -- COPD on Spiriva, couldn't tolerate Symbicort. Has not yet had repeat  PFT formerly FEV1 1.61 (46% pred).  Tells me that he has been having URI sx since last time. He thinks his Spiriva gives him cough, continues to have PND and runny eyes on loratadine. He has waked up SOB, witnessed apneas, snoring.   ROV 01/15/12 -- COPD, allergies and cough, ? OSA. Had PSG done 3/25, repeat PFT today. FEV1 1.56, stable compared with 1.61 in 04/2009. Added Qnasl last time and it is helpful. Still has cough and throat congestion but better on the Qnasl.   ROV 05/20/12 -- COPD, allergies and cough. Also ? OSA dx with equivocal PSG in March '13. We changed Qnasl to fluticasone due to insurance; also  changed proventil to ProAir >> he had tongue swelling with the proair, had to stop it. He was changed back to proventil by [facility]. He prefers the Qnasl to the fluticasone because of better delivery. Remains on Spiriva. Remains on O2 2L/min. He is willing to return to the sleep lab with an ambien to help get a better study.   ROV 09/23/12 -- COPD, allergies and cough. Also ? OSA dx with equivocal PSG in March '13. Since last time he has stopped Spiriva. He feels that he has done OK off of it. He continues to use O2 w exertion. His exertional tolerance is better. He has lost 20 lbs since last time. He did not have the repeat PSG (w Remus Loffler).   ROV 02/11/13 -- COPD, allergies and chronic cough. Uses O2 w exertion, set on 2L/min. Had an equivocal PSG, still suspicion for OSA. Not currently on BD exertion. He is smoking, restarted in Feb or March.  On zyrtec, not on a nasal steroid right now. He is using albuterol prn, averages once a day, helps him clear mucous. We've tried long-acting BD's, but he has always believed that it makes him feel worse.   ROV 06/18/13 -- COPD, allergies and chronic cough. Uses O2 w exertion, set on 2L/min. He has had some success stopping smoking using nicotine patches, but has restarted. He is using nicotine  lozenges to cut down, wants to stop. His breathing has been stable. He never restarted Spiriva. He is having nasal drainage, not on Qnasl. His is taking zyrtec.      Objective:   Physical Exam Filed Vitals:   06/18/13 0929  BP: 140/88  Pulse: 120  Temp: 99.3 F (37.4 C)   GEN: A/Ox3; pleasant , NAD, obese   HEENT:  [place]/AT,  EACs-clear, TMs-wnl, NOSE-clear, THROAT-clear, no lesions, no postnasal drip or exudate noted.   NECK:  Supple w/ fair ROM; no JVD; normal carotid impulses w/o bruits; no thyromegaly or nodules palpated; no lymphadenopathy.  RESP  Coarse BS w/o, wheezes/ rales/ or rhonchi.no accessory muscle use, no dullness to percussion  CARD:  RRR, no  m/r/g  , no peripheral edema, pulses intact, no cyanosis or clubbing.  Musco: Warm bil, no deformities or joint swelling noted.   Neuro: alert, no focal deficits noted.    Skin: Warm, no lesions or rashes    Assessment & Plan:  COPD (chronic obstructive pulmonary disease) He is not currently on spiriva, uses albuterol prn. He minimizes his dyspnea, biggest complaint is his drainage and cough. He is interested in smoking cessation and we spent most of our time today discussing this.  - continue albuterol prn - flu shot today - discussed smoking cessation - will need to be on a scheduled medication again at sometime in the future.  - rov 3

## 2013-09-12 ENCOUNTER — Ambulatory Visit (INDEPENDENT_AMBULATORY_CARE_PROVIDER_SITE_OTHER): Payer: Medicare Other | Admitting: Emergency Medicine

## 2013-09-12 ENCOUNTER — Encounter: Payer: Self-pay | Admitting: Emergency Medicine

## 2013-09-12 VITALS — BP 104/74 | HR 108 | Ht 71.0 in | Wt 247.0 lb

## 2013-09-12 DIAGNOSIS — J449 Chronic obstructive pulmonary disease, unspecified: Secondary | ICD-10-CM

## 2013-09-12 NOTE — Progress Notes (Signed)
Subjective:    Patient ID: John Casey, male    DOB: [birth year], 62 y.o.   MRN: [id number] HPI 62 yo man, hx Hep C, DM, former tobacco 40 pk-yrs, COPD w FEV1 1.61 (46% pred),  05/23/11 OV  Has been on Spiriva and Proventil x 8 months. Was formerly on Advair, stopped due to cough. He is referred for his COPD. He was treated for PNA in 2003 that required MV. He was left with LLL rounded atx that has been stable on serial films. Last CT scan was 05/09/11, showed stable since 10/2008, also stable small pulm nodules. He uses SABA about every other day. He underwent liver biopsy 3 weeks ago to evaluate for cirrhosis. He describes cough, nasal drainage and allergies. Rarely has GERD symptoms. >>rx symbicort   06/09/11 Acute OV Pt returns for persistent cough . Says since starting symbicort 2 weeks ago, cough is worse. Has tickling cough with white mucus at times, wheezing , tightness and chills. No discolored mucus, n/v, or fever. Feels symbicort is making him worse.  No overt reflux .  Currently on Spriiva.    ROV 06/23/11 -- COPD on spiriva. Last time started Symbicort but it made him cough, gave him sore gums so stopped. He thought he had a URI, but currently doing better. Using proventil prn, averages 2x a day.   ROV 11/27/11 -- COPD on Spiriva, couldn't tolerate Symbicort. Has not yet had repeat  PFT formerly FEV1 1.61 (46% pred).  Tells me that he has been having URI sx since last time. He thinks his Spiriva gives him cough, continues to have PND and runny eyes on loratadine. He has waked up SOB, witnessed apneas, snoring.   ROV 01/15/12 -- COPD, allergies and cough, ? OSA. Had PSG done 3/25, repeat PFT today. FEV1 1.56, stable compared with 1.61 in 04/2009. Added Qnasl last time and it is helpful. Still has cough and throat congestion but better on the Qnasl.   ROV 05/20/12 -- COPD, allergies and cough. Also ? OSA dx with equivocal PSG in March '13. We changed Qnasl to fluticasone due to insurance; also  changed proventil to ProAir >> he had tongue swelling with the proair, had to stop it. He was changed back to proventil by [facility]. He prefers the Qnasl to the fluticasone because of better delivery. Remains on Spiriva. Remains on O2 2L/min. He is willing to return to the sleep lab with an ambien to help get a better study.   ROV 09/23/12 -- COPD, allergies and cough. Also ? OSA dx with equivocal PSG in March '13. Since last time he has stopped Spiriva. He feels that he has done OK off of it. He continues to use O2 w exertion. His exertional tolerance is better. He has lost 20 lbs since last time. He did not have the repeat PSG (w Remus Loffler).   ROV 02/11/13 -- COPD, allergies and chronic cough. Uses O2 w exertion, set on 2L/min. Had an equivocal PSG, still suspicion for OSA. Not currently on BD exertion. He is smoking, restarted in Feb or March.  On zyrtec, not on a nasal steroid right now. He is using albuterol prn, averages once a day, helps him clear mucous. We've tried long-acting BD's, but he has always believed that it makes him feel worse.   ROV 06/18/13 -- COPD, allergies and chronic cough. Uses O2 w exertion, set on 2L/min. He has had some success stopping smoking using nicotine patches, but has restarted. He is using nicotine  lozenges to cut down, wants to stop. His breathing has been stable. He never restarted Spiriva. He is having nasal drainage, not on Qnasl. His is taking zyrtec.   ROV 09/12/13 -- COPD, allergies and chronic cough, stable LLL nodule - rounded atx. Uses O2 w exertion, set on 2L/min. Still smoking > very rarely, has been using e-cig. He developed URI sx about 2 weeks ago, lots of cough prod of white phlegm. He uses Spiriva but not every day + albuterol prn. He is compliant w his O2. Last Ct chest was 05/09/11.      Objective:   Physical Exam Filed Vitals:   09/12/13 0945  BP: 104/74  Pulse: 108  Height: 5\' 11"  (1.803 m)  Weight: 247 lb (112.038 kg)  SpO2: 94%   GEN:  A/Ox3; pleasant , NAD, obese   HEENT:  [place]/AT,  EACs-clear, TMs-wnl, NOSE-clear, THROAT-clear, no lesions, no postnasal drip or exudate noted.   NECK:  Supple w/ fair ROM; no JVD; normal carotid impulses w/o bruits; no thyromegaly or nodules palpated; no lymphadenopathy.  RESP  Coarse BS w/o, wheezes/ rales/ or rhonchi.no accessory muscle use,   CARD:  RRR, no m/r/g  , no peripheral edema, pulses intact, no cyanosis or clubbing.  Musco: Warm bil, no deformities or joint swelling noted.   Neuro: alert, no focal deficits noted.    Skin: Warm, no lesions or rashes    Assessment & Plan:  COPD (chronic obstructive pulmonary disease) Without current exacerbation despite recent URI.  - he does not use Spiriva frequently, feels that it hurts more than it helps. I told him that he could stop it altogether if he wanted. He will think about this

## 2013-09-12 NOTE — Assessment & Plan Note (Signed)
Without current exacerbation despite recent URI.  - he does not use Spiriva frequently, feels that it hurts more than it helps. I told him that he could stop it altogether if he wanted. He will think about this

## 2013-09-12 NOTE — Patient Instructions (Signed)
Please continue your current medications as you are taking them Usually her oxygen with exertion Call our office if he developed fever, increased shortness of breath, increased wheezing, or change in the color of your mucus Follow with Dr Delton Coombes in 4 months or sooner if you have any problems.

## 2013-11-13 ENCOUNTER — Other Ambulatory Visit: Payer: Self-pay | Admitting: Internal Medicine

## 2013-11-13 DIAGNOSIS — I10 Essential (primary) hypertension: Secondary | ICD-10-CM

## 2013-11-13 DIAGNOSIS — C220 Liver cell carcinoma: Secondary | ICD-10-CM

## 2013-11-26 ENCOUNTER — Ambulatory Visit
Admission: RE | Admit: 2013-11-26 | Discharge: 2013-11-26 | Disposition: A | Discharge status: Home / Self Care | Payer: Medicare Other | Source: Ambulatory Visit | Attending: Internal Medicine | Admitting: Internal Medicine

## 2013-11-26 DIAGNOSIS — C220 Liver cell carcinoma: Secondary | ICD-10-CM

## 2013-11-27 ENCOUNTER — Other Ambulatory Visit: Payer: Self-pay | Admitting: Nurse Practitioner

## 2013-11-27 DIAGNOSIS — C220 Liver cell carcinoma: Secondary | ICD-10-CM

## 2013-12-02 ENCOUNTER — Ambulatory Visit
Admission: RE | Admit: 2013-12-02 | Discharge: 2013-12-02 | Disposition: A | Discharge status: Home / Self Care | Payer: Medicare Other | Source: Ambulatory Visit | Attending: Nurse Practitioner | Admitting: Nurse Practitioner

## 2013-12-02 DIAGNOSIS — C220 Liver cell carcinoma: Secondary | ICD-10-CM

## 2013-12-02 MED ORDER — IOHEXOL 350 MG/ML SOLN
125.0000 mL | Freq: Once | INTRAVENOUS | Status: AC | PRN
  Administered 2013-12-02: 125 mL via INTRAVENOUS

## 2014-01-05 ENCOUNTER — Other Ambulatory Visit: Payer: Self-pay | Admitting: Gastroenterology

## 2014-01-05 DIAGNOSIS — Z1211 Encounter for screening for malignant neoplasm of colon: Secondary | ICD-10-CM

## 2014-01-09 ENCOUNTER — Other Ambulatory Visit: Payer: Self-pay | Admitting: Gastroenterology

## 2014-01-09 ENCOUNTER — Ambulatory Visit
Admission: RE | Admit: 2014-01-09 | Discharge: 2014-01-09 | Disposition: A | Discharge status: Home / Self Care | Payer: Medicare Other | Source: Ambulatory Visit | Attending: Gastroenterology | Admitting: Gastroenterology

## 2014-01-09 DIAGNOSIS — Z1211 Encounter for screening for malignant neoplasm of colon: Secondary | ICD-10-CM

## 2014-02-25 ENCOUNTER — Emergency Department (HOSPITAL_COMMUNITY): Payer: Medicare Other

## 2014-02-25 ENCOUNTER — Encounter (HOSPITAL_COMMUNITY): Payer: Self-pay | Admitting: Emergency Medicine

## 2014-02-25 ENCOUNTER — Emergency Department (HOSPITAL_COMMUNITY)
Admission: EM | Admit: 2014-02-25 | Discharge: 2014-02-25 | Disposition: A | Discharge status: Home / Self Care | Payer: Medicare Other | Attending: Emergency Medicine | Admitting: Emergency Medicine

## 2014-02-25 DIAGNOSIS — IMO0002 Reserved for concepts with insufficient information to code with codable children: Secondary | ICD-10-CM | POA: Insufficient documentation

## 2014-02-25 DIAGNOSIS — Z7982 Long term (current) use of aspirin: Secondary | ICD-10-CM | POA: Insufficient documentation

## 2014-02-25 DIAGNOSIS — J449 Chronic obstructive pulmonary disease, unspecified: Secondary | ICD-10-CM | POA: Insufficient documentation

## 2014-02-25 DIAGNOSIS — Z8619 Personal history of other infectious and parasitic diseases: Secondary | ICD-10-CM | POA: Insufficient documentation

## 2014-02-25 DIAGNOSIS — R1011 Right upper quadrant pain: Secondary | ICD-10-CM | POA: Insufficient documentation

## 2014-02-25 DIAGNOSIS — R197 Diarrhea, unspecified: Secondary | ICD-10-CM | POA: Insufficient documentation

## 2014-02-25 DIAGNOSIS — R000 Tachycardia, unspecified: Secondary | ICD-10-CM | POA: Insufficient documentation

## 2014-02-25 DIAGNOSIS — E119 Type 2 diabetes mellitus without complications: Secondary | ICD-10-CM | POA: Insufficient documentation

## 2014-02-25 DIAGNOSIS — Z79899 Other long term (current) drug therapy: Secondary | ICD-10-CM | POA: Insufficient documentation

## 2014-02-25 DIAGNOSIS — R160 Hepatomegaly, not elsewhere classified: Secondary | ICD-10-CM | POA: Insufficient documentation

## 2014-02-25 DIAGNOSIS — J4489 Other specified chronic obstructive pulmonary disease: Secondary | ICD-10-CM | POA: Insufficient documentation

## 2014-02-25 DIAGNOSIS — R109 Unspecified abdominal pain: Secondary | ICD-10-CM

## 2014-02-25 DIAGNOSIS — M545 Low back pain, unspecified: Secondary | ICD-10-CM | POA: Insufficient documentation

## 2014-02-25 DIAGNOSIS — I10 Essential (primary) hypertension: Secondary | ICD-10-CM | POA: Insufficient documentation

## 2014-02-25 DIAGNOSIS — Z8505 Personal history of malignant neoplasm of liver: Secondary | ICD-10-CM | POA: Insufficient documentation

## 2014-02-25 DIAGNOSIS — F17200 Nicotine dependence, unspecified, uncomplicated: Secondary | ICD-10-CM | POA: Insufficient documentation

## 2014-02-25 DIAGNOSIS — M546 Pain in thoracic spine: Secondary | ICD-10-CM | POA: Insufficient documentation

## 2014-02-25 DIAGNOSIS — M549 Dorsalgia, unspecified: Secondary | ICD-10-CM

## 2014-02-25 HISTORY — DX: Malignant neoplasm of liver, not specified as primary or secondary: C22.9

## 2014-02-25 LAB — URINALYSIS, ROUTINE W REFLEX MICROSCOPIC
GLUCOSE, UA: NEGATIVE mg/dL
Ketones, ur: NEGATIVE mg/dL
LEUKOCYTES UA: NEGATIVE
Nitrite: NEGATIVE
PH: 5 (ref 5.0–8.0)
Protein, ur: 30 mg/dL — AB
SPECIFIC GRAVITY, URINE: 1.014 (ref 1.005–1.030)
Urobilinogen, UA: 0.2 mg/dL (ref 0.0–1.0)

## 2014-02-25 LAB — COMPREHENSIVE METABOLIC PANEL
ALBUMIN: 2.5 g/dL — AB (ref 3.5–5.2)
ALT: 18 U/L (ref 0–53)
AST: 98 U/L — AB (ref 0–37)
Alkaline Phosphatase: 331 U/L — ABNORMAL HIGH (ref 39–117)
BUN: 15 mg/dL (ref 6–23)
CALCIUM: 9.7 mg/dL (ref 8.4–10.5)
CO2: 24 mEq/L (ref 19–32)
CREATININE: 0.73 mg/dL (ref 0.50–1.35)
Chloride: 101 mEq/L (ref 96–112)
GFR calc Af Amer: >90 mL/min (ref >90)
GFR calc non Af Amer: >90 mL/min (ref >90)
Glucose, Bld: 151 mg/dL — ABNORMAL HIGH (ref 70–99)
Potassium: 3.9 mEq/L (ref 3.7–5.3)
Sodium: 142 mEq/L (ref 137–147)
TOTAL PROTEIN: 8.4 g/dL — AB (ref 6.0–8.3)
Total Bilirubin: 1 mg/dL (ref 0.3–1.2)

## 2014-02-25 LAB — URINE MICROSCOPIC-ADD ON

## 2014-02-25 LAB — CBC WITH DIFFERENTIAL/PLATELET
BASOS ABS: 0 10*3/uL (ref 0.0–0.1)
BASOS PCT: 0 % (ref 0–1)
EOS ABS: 0 10*3/uL (ref 0.0–0.7)
EOS PCT: 0 % (ref 0–5)
HEMATOCRIT: 36.4 % — AB (ref 39.0–52.0)
Hemoglobin: 12.2 g/dL — ABNORMAL LOW (ref 13.0–17.0)
Lymphocytes Relative: 14 % (ref 12–46)
Lymphs Abs: 1.2 10*3/uL (ref 0.7–4.0)
MCH: 26.3 pg (ref 26.0–34.0)
MCHC: 33.5 g/dL (ref 30.0–36.0)
MCV: 78.6 fL (ref 78.0–100.0)
MONO ABS: 0.7 10*3/uL (ref 0.1–1.0)
Monocytes Relative: 9 % (ref 3–12)
Neutro Abs: 6.4 10*3/uL (ref 1.7–7.7)
Neutrophils Relative %: 77 % (ref 43–77)
Platelets: 257 10*3/uL (ref 150–400)
RBC: 4.63 MIL/uL (ref 4.22–5.81)
RDW: 18.3 % — AB (ref 11.5–15.5)
WBC: 8.4 10*3/uL (ref 4.0–10.5)

## 2014-02-25 LAB — LIPASE, BLOOD: Lipase: 39 U/L (ref 11–59)

## 2014-02-25 MED ORDER — OXYCODONE HCL 5 MG PO TABS: 5.0000 mg | ORAL_TABLET | ORAL | Status: AC | PRN

## 2014-02-25 MED ORDER — DIPHENHYDRAMINE HCL 25 MG PO CAPS
50.0000 mg | ORAL_CAPSULE | Freq: Once | ORAL | Status: AC
  Administered 2014-02-25: 50 mg via ORAL
  Filled 2014-02-25: qty 2

## 2014-02-25 MED ORDER — HYDROMORPHONE HCL PF 1 MG/ML IJ SOLN
1.0000 mg | Freq: Once | INTRAMUSCULAR | Status: AC
  Administered 2014-02-25: 1 mg via INTRAVENOUS
  Filled 2014-02-25: qty 1

## 2014-02-25 MED ORDER — SODIUM CHLORIDE 0.9 % IV BOLUS (SEPSIS)
1000.0000 mL | Freq: Once | INTRAVENOUS | Status: AC
  Administered 2014-02-25: 1000 mL via INTRAVENOUS

## 2014-02-25 NOTE — ED Notes (Signed)
Bed: WA09 Expected date:  Expected time:  Means of arrival:  Comments: EMS- abdominal pain, Hx of liver CA

## 2014-02-25 NOTE — ED Notes (Signed)
Patient transported to Ultrasound 

## 2014-02-25 NOTE — ED Notes (Signed)
Pt aware a urine sample is needed.  Pt sts he is currently unable to urinate. Pt was given a urinal.

## 2014-02-25 NOTE — ED Notes (Signed)
MD at bedside. 

## 2014-02-25 NOTE — ED Notes (Signed)
Per EMS. Pt reports abd pain with radiation to lower back for last 9 months. Hx of liver ca. Went to clinic for pain prescription, but prescription declined. Pt ambulatory to restroom

## 2014-02-25 NOTE — ED Provider Notes (Signed)
CSN: [id number]     Arrival date & time 02/25/14  1102 History   First MD Initiated Contact with Patient 02/25/14 1106     Chief Complaint  Patient presents with  . Abdominal Pain     (Consider location/radiation/quality/duration/timing/severity/associated sxs/prior Treatment) HPI 63 year old male presents with back and abdominal pain over the past 6-8 months. He states he has not had any pain medicine from his primary care provider. He has a history of liver cancer and is going to be starting chemotherapy next week. He denies any nausea or vomiting but states he has chronic diarrhea over the last several months as well. He rates the pain as severe. When asked to point to the worse spot of pain he points to his right upper quadrant. He has a history of hepatitis C per the records. He states he's also been eating poorly over the last 2-3 months. Intermittent he has been having dysuria as well. Denies any fevers. He states his abdomen was bloated earlier but now it is decreasing in size.  Past Medical History  Diagnosis Date  . COPD (chronic obstructive pulmonary disease)   . Diabetes mellitus   . Hypertension   . Pulmonary nodules   . Liver cancer    History reviewed. No pertinent past surgical history. Family History  Problem Relation Age of Onset  . Heart disease Father    History  Substance Use Topics  . Smoking status: Current Some Day Smoker -- 38 years    Types: Cigarettes  . Smokeless tobacco: Never Used     Comment: 5 cigs per day 02/11/13  . Alcohol Use: No    Review of Systems  Constitutional: Negative for fever.  Gastrointestinal: Positive for abdominal pain and diarrhea. Negative for nausea, vomiting and abdominal distention.  Musculoskeletal: Positive for back pain.  Neurological: Negative for weakness.  All other systems reviewed and are negative.     Allergies  Review of patient's allergies indicates no known allergies.  Home Medications   Prior to  Admission medications   Medication Sig Start Date End Date Taking? Authorizing Provider  albuterol (PROVENTIL) 90 MCG/ACT inhaler Inhale 2 puffs into the lungs every 4 (four) hours as needed.      Historical Provider, MD  aspirin 81 MG tablet Take 81 mg by mouth daily.      Historical Provider, MD  Beclomethasone Dipropionate (QNASL) 80 MCG/ACT AERS Place 2 sprays into the nose daily. 02/24/13   Collene Gobble, MD  cetirizine (ZYRTEC) 10 MG tablet Take 10 mg by mouth daily.    Historical Provider, MD  cloNIDine (CATAPRES) 0.1 MG tablet Take 0.1 mg by mouth 2 (two) times daily.    Historical Provider, MD  gabapentin (NEURONTIN) 300 MG capsule Take 600 mg by mouth 3 (three) times daily.     Historical Provider, MD  indapamide (LOZOL) 1.25 MG tablet Take 1.25 mg by mouth daily.    Historical Provider, MD  losartan (COZAAR) 50 MG tablet Take 50 mg by mouth daily.    Historical Provider, MD  sitaGLIPtan-metformin (JANUMET) 50-1000 MG per tablet Take 1 tablet by mouth 2 (two) times daily with a meal.    Historical Provider, MD  SPIRIVA HANDIHALER 18 MCG inhalation capsule INHALE 2 INHALATIONS OF ONE CAPSULE CONTENTS ONCE DAILY 02/27/12   Areta Haber Dunn, PA-C   BP 125/65  Pulse 124  Temp(Src) 97.5 F (36.4 C) (Oral)  Resp 20  SpO2 90% Physical Exam  Nursing note and vitals reviewed. Constitutional:  He is oriented to person, place, and time. He appears well-developed and well-nourished.  HENT:  Head: Normocephalic and atraumatic.  Right Ear: External ear normal.  Left Ear: External ear normal.  Nose: Nose normal.  Eyes: Right eye exhibits no discharge. Left eye exhibits no discharge.  Neck: Neck supple.  Cardiovascular: Regular rhythm, normal heart sounds and intact distal pulses.  Tachycardia present.   Pulmonary/Chest: Effort normal.  Abdominal: Soft. He exhibits no distension. There is hepatomegaly. There is tenderness in the right upper quadrant.  Musculoskeletal: He exhibits no edema.        Thoracic back: He exhibits tenderness.       Lumbar back: He exhibits tenderness.  Neurological: He is alert and oriented to person, place, and time.  Skin: Skin is warm and dry.    ED Course  Procedures (including critical care time) Labs Review Labs Reviewed  CBC WITH DIFFERENTIAL - Abnormal; Notable for the following:    Hemoglobin 12.2 (*)    HCT 36.4 (*)    RDW 18.3 (*)    All other components within normal limits  COMPREHENSIVE METABOLIC PANEL - Abnormal; Notable for the following:    Glucose, Bld 151 (*)    Total Protein 8.4 (*)    Albumin 2.5 (*)    AST 98 (*)    Alkaline Phosphatase 331 (*)    All other components within normal limits  URINALYSIS, ROUTINE W REFLEX MICROSCOPIC - Abnormal; Notable for the following:    APPearance CLOUDY (*)    Hgb urine dipstick MODERATE (*)    Bilirubin Urine MODERATE (*)    Protein, ur 30 (*)    All other components within normal limits  URINE MICROSCOPIC-ADD ON - Abnormal; Notable for the following:    Bacteria, UA FEW (*)    All other components within normal limits  LIPASE, BLOOD    Imaging Review Dg Thoracic Spine 2 View  02/25/2014   CLINICAL DATA:  Thoracic back pain.  EXAM: THORACIC SPINE - 2 VIEW  COMPARISON:  None.  FINDINGS: There is no evidence of thoracic spine fracture. Alignment is normal. Intervertebral disc spaces are maintained. Diffuse thoracic syndesmophyte formation is seen without disc space narrowing. This is consistent with diffuse idiopathic skeletal hyperostosis, which is of no clinical significance.  IMPRESSION: No acute findings or other significant abnormality identified.   Electronically Signed   By: Earle Gell M.D.   On: 02/25/2014 12:40   Dg Lumbar Spine Complete  02/25/2014   CLINICAL DATA:  Progressive low back pain.  No known injury.  EXAM: LUMBAR SPINE - COMPLETE 4+ VIEW  COMPARISON:  05/09/2011  FINDINGS: There is no evidence of lumbar spine fracture. Alignment is normal. Mild vertebral osteophytosis  noted, however the intervertebral disc spaces are maintained. No significant facet arthropathy or other bone lesions identified. Atherosclerotic calcification of the abdominal aorta and iliac arteries noted, without definite aneurysm.  IMPRESSION: No acute findings.  Mild degenerative vertebral osteophytosis, without significant disc space narrowing.   Electronically Signed   By: Earle Gell M.D.   On: 02/25/2014 13:03   US Abdomen Complete  02/25/2014   CLINICAL DATA:  ABDOMINAL PAIN RUQ, istory of liver cancer, diabetes, hypertension  EXAM: ULTRASOUND ABDOMEN COMPLETE  COMPARISON:  None.  FINDINGS: Gallbladder:  There is diffuse thickening of the gallbladder wall measuring 9 mm. No appreciable pericholecystic fluid. No sonographic Murphy sign noted.  Common bile duct:  Diameter: 5 mm  Liver:  Multiple heterogeneous masses scattered throughout the  liver. This is consistent with the patient's history of liver cancer. The largest mass is in the right lobe and measures 8.3 x 6.5 cm. Dystrophic calcifications are appreciated within the inferior aspect of the right lobe of the liver.  IVC:  No abnormality visualized.  Pancreas:  Visualized portion unremarkable.  Spleen:  The spleen is enlarged with a volume of 539.9 cubic cm  Right Kidney:  Length: 13.6 cm. Echogenicity within normal limits. No mass or hydronephrosis visualized.  Left Kidney:  Length: 13.2 cm. Echogenicity within normal limits. No mass or hydronephrosis visualized.  Abdominal aorta:  No aneurysm visualized.  Other findings:  Mild perihepatic ascites is appreciated.  IMPRESSION: Diffuse gallbladder wall thickening. There is no evidence of pericholecystic fluid nor sonographic Murphy's sign. Differential considerations include acalculous cholecystitis, edema secondary to ascites, neoplastic infiltration cannot be excluded considering the patient's history. Clinical correlation recommended and surgical consultation clinically warranted.  Splenomegaly  likely secondary to portal venous congestion considering the diffuse neoplastic infiltration of the liver.   Electronically Signed   By: Margaree Mackintosh M.D.   On: 02/25/2014 15:12     EKG Interpretation None      MDM   Final diagnoses:  Abdominal pain  Back pain    Patient with pain for 6-8 months. Patient relates his pain has not worsened but states no one has treated his pain. No evidence of any prescriptions in the narcotic database. Pain improved after one dose of dilaudid. Afebrile here, and HR improved with fluids and pain meds. No vomiting or other concerning signs. I feel that given his cancer he warrants pain control as outpatient. I discussed his gallbladder findings with surgery, who recommend surgery or GI follow up as this is likely cancer related since he has had no change in pain and this appears more chronic. Discussed return precautions with patient, and he is well appearing and stable for discharge.    Ephraim Hamburger, MD 02/25/14 [phone]

## 2014-02-25 NOTE — Discharge Instructions (Signed)
Abdominal Pain, Adult Many things can cause abdominal pain. Usually, abdominal pain is not caused by a disease and will improve without treatment. It can often be observed and treated at home. Your health care provider will do a physical exam and possibly order blood tests and X-rays to help determine the seriousness of your pain. However, in many cases, more time must pass before a clear cause of the pain can be found. Before that point, your health care provider may not know if you need more testing or further treatment. HOME CARE INSTRUCTIONS  Monitor your abdominal pain for any changes. The following actions may help to alleviate any discomfort you are experiencing:  Only take over-the-counter or prescription medicines as directed by your health care provider.  Do not take laxatives unless directed to do so by your health care provider.  Try a clear liquid diet (broth, tea, or water) as directed by your health care provider. Slowly move to a bland diet as tolerated. SEEK MEDICAL CARE IF:  You have unexplained abdominal pain.  You have abdominal pain associated with nausea or diarrhea.  You have pain when you urinate or have a bowel movement.  You experience abdominal pain that wakes you in the night.  You have abdominal pain that is worsened or improved by eating food.  You have abdominal pain that is worsened with eating fatty foods. SEEK IMMEDIATE MEDICAL CARE IF:   Your pain does not go away within 2 hours.  You have a fever.  You keep throwing up (vomiting).  Your pain is felt only in portions of the abdomen, such as the right side or the left lower portion of the abdomen.  You pass bloody or black tarry stools. MAKE SURE YOU:  Understand these instructions.   Will watch your condition.   Will get help right away if you are not doing well or get worse.  Document Released: 06/21/2005 Document Revised: 07/02/2013 Document Reviewed: 05/21/2013 [facility] Patient  Information 2014 [place].     Back Pain, Adult Low back pain is very common. About 1 in 5 people have back pain.The cause of low back pain is rarely dangerous. The pain often gets better over time.About half of people with a sudden onset of back pain feel better in just 2 weeks. About 8 in 10 people feel better by 6 weeks.  CAUSES Some common causes of back pain include:  Strain of the muscles or ligaments supporting the spine.  Wear and tear (degeneration) of the spinal discs.  Arthritis.  Direct injury to the back. DIAGNOSIS Most of the time, the direct cause of low back pain is not known.However, back pain can be treated effectively even when the exact cause of the pain is unknown.Answering your caregiver's questions about your overall health and symptoms is one of the most accurate ways to make sure the cause of your pain is not dangerous. If your caregiver needs more information, he or she may order lab work or imaging tests (X-rays or MRIs).However, even if imaging tests show changes in your back, this usually does not require surgery. HOME CARE INSTRUCTIONS For many people, back pain returns.Since low back pain is rarely dangerous, it is often a condition that people can learn to [facility] their own.   Remain active. It is stressful on the back to sit or stand in one place. Do not sit, drive, or stand in one place for more than 30 minutes at a time. Take short walks on level surfaces  as soon as pain allows.Try to increase the length of time you walk each day.  Do not stay in bed.Resting more than 1 or 2 days can delay your recovery.  Do not avoid exercise or work.Your body is made to move.It is not dangerous to be active, even though your back may hurt.Your back will likely heal faster if you return to being active before your pain is gone.  Pay attention to your body when you bend and lift. Many people have less discomfortwhen lifting if they bend their knees,  keep the load close to their bodies,and avoid twisting. Often, the most comfortable positions are those that put less stress on your recovering back.  Find a comfortable position to sleep. Use a firm mattress and lie on your side with your knees slightly bent. If you lie on your back, put a pillow under your knees.  Only take over-the-counter or prescription medicines as directed by your caregiver. Over-the-counter medicines to reduce pain and inflammation are often the most helpful.Your caregiver may prescribe muscle relaxant drugs.These medicines help dull your pain so you can more quickly return to your normal activities and healthy exercise.  Put ice on the injured area.  Put ice in a plastic bag.  Place a towel between your skin and the bag.  Leave the ice on for 15-20 minutes, 03-04 times a day for the first 2 to 3 days. After that, ice and heat may be alternated to reduce pain and spasms.  Ask your caregiver about trying back exercises and gentle massage. This may be of some benefit.  Avoid feeling anxious or stressed.Stress increases muscle tension and can worsen back pain.It is important to recognize when you are anxious or stressed and learn ways to manage it.Exercise is a great option. SEEK MEDICAL CARE IF:  You have pain that is not relieved with rest or medicine.  You have pain that does not improve in 1 week.  You have new symptoms.  You are generally not feeling well. SEEK IMMEDIATE MEDICAL CARE IF:   You have pain that radiates from your back into your legs.  You develop new bowel or bladder control problems.  You have unusual weakness or numbness in your arms or legs.  You develop nausea or vomiting.  You develop abdominal pain.  You feel faint. Document Released: 09/11/2005 Document Revised: 03/12/2012 Document Reviewed: 01/30/2011 [facility] Patient Information 2014 [place], [place].

## 2014-02-26 ENCOUNTER — Other Ambulatory Visit: Payer: Self-pay | Admitting: Nurse Practitioner

## 2014-02-26 DIAGNOSIS — C220 Liver cell carcinoma: Secondary | ICD-10-CM

## 2014-02-27 ENCOUNTER — Telehealth: Payer: Self-pay | Admitting: Oncology

## 2014-02-27 NOTE — Telephone Encounter (Signed)
S/W PATIENT AND GAVE NP APPT FOR 06/22 @ 1:30 W/DR. SHERRILL.  REFERRING DR. DAWN DRAZEK DX- MALIGNANT NEOPLASM OF LIVER WELCOME PACKET MAILED.

## 2014-02-27 NOTE — Telephone Encounter (Signed)
C/D 02/27/14 for appt. 03/16/14

## 2014-03-11 ENCOUNTER — Inpatient Hospital Stay: Admission: RE | Admit: 2014-03-11 | Payer: Self-pay | Source: Ambulatory Visit

## 2014-03-11 ENCOUNTER — Emergency Department (HOSPITAL_COMMUNITY): Payer: Medicare Other

## 2014-03-11 ENCOUNTER — Inpatient Hospital Stay (HOSPITAL_COMMUNITY): Payer: Medicare Other

## 2014-03-11 ENCOUNTER — Encounter (HOSPITAL_COMMUNITY): Payer: Self-pay | Admitting: Emergency Medicine

## 2014-03-11 ENCOUNTER — Inpatient Hospital Stay (HOSPITAL_COMMUNITY)
Admission: EM | Admit: 2014-03-11 | Discharge: 2014-03-25 | DRG: 435 | Disposition: E | Payer: Medicare Other | Attending: Internal Medicine | Admitting: Internal Medicine

## 2014-03-11 DIAGNOSIS — E872 Acidosis, unspecified: Secondary | ICD-10-CM | POA: Diagnosis present

## 2014-03-11 DIAGNOSIS — I5023 Acute on chronic systolic (congestive) heart failure: Secondary | ICD-10-CM | POA: Diagnosis present

## 2014-03-11 DIAGNOSIS — E1129 Type 2 diabetes mellitus with other diabetic kidney complication: Secondary | ICD-10-CM | POA: Diagnosis present

## 2014-03-11 DIAGNOSIS — C228 Malignant neoplasm of liver, primary, unspecified as to type: Principal | ICD-10-CM | POA: Diagnosis present

## 2014-03-11 DIAGNOSIS — I10 Essential (primary) hypertension: Secondary | ICD-10-CM | POA: Diagnosis present

## 2014-03-11 DIAGNOSIS — I509 Heart failure, unspecified: Secondary | ICD-10-CM | POA: Diagnosis present

## 2014-03-11 DIAGNOSIS — J4489 Other specified chronic obstructive pulmonary disease: Secondary | ICD-10-CM | POA: Diagnosis present

## 2014-03-11 DIAGNOSIS — R053 Chronic cough: Secondary | ICD-10-CM

## 2014-03-11 DIAGNOSIS — M545 Low back pain, unspecified: Secondary | ICD-10-CM | POA: Diagnosis present

## 2014-03-11 DIAGNOSIS — K859 Acute pancreatitis without necrosis or infection, unspecified: Secondary | ICD-10-CM

## 2014-03-11 DIAGNOSIS — N179 Acute kidney failure, unspecified: Secondary | ICD-10-CM

## 2014-03-11 DIAGNOSIS — R296 Repeated falls: Secondary | ICD-10-CM

## 2014-03-11 DIAGNOSIS — E861 Hypovolemia: Secondary | ICD-10-CM | POA: Diagnosis present

## 2014-03-11 DIAGNOSIS — C229 Malignant neoplasm of liver, not specified as primary or secondary: Secondary | ICD-10-CM

## 2014-03-11 DIAGNOSIS — Z515 Encounter for palliative care: Secondary | ICD-10-CM

## 2014-03-11 DIAGNOSIS — G8929 Other chronic pain: Secondary | ICD-10-CM | POA: Diagnosis present

## 2014-03-11 DIAGNOSIS — I5021 Acute systolic (congestive) heart failure: Secondary | ICD-10-CM | POA: Diagnosis present

## 2014-03-11 DIAGNOSIS — J449 Chronic obstructive pulmonary disease, unspecified: Secondary | ICD-10-CM | POA: Diagnosis present

## 2014-03-11 DIAGNOSIS — Z66 Do not resuscitate: Secondary | ICD-10-CM | POA: Diagnosis present

## 2014-03-11 DIAGNOSIS — N058 Unspecified nephritic syndrome with other morphologic changes: Secondary | ICD-10-CM | POA: Diagnosis present

## 2014-03-11 DIAGNOSIS — J189 Pneumonia, unspecified organism: Secondary | ICD-10-CM | POA: Diagnosis present

## 2014-03-11 DIAGNOSIS — N19 Unspecified kidney failure: Secondary | ICD-10-CM

## 2014-03-11 DIAGNOSIS — E46 Unspecified protein-calorie malnutrition: Secondary | ICD-10-CM | POA: Diagnosis present

## 2014-03-11 DIAGNOSIS — F17200 Nicotine dependence, unspecified, uncomplicated: Secondary | ICD-10-CM | POA: Diagnosis present

## 2014-03-11 DIAGNOSIS — Z9181 History of falling: Secondary | ICD-10-CM

## 2014-03-11 DIAGNOSIS — Z6828 Body mass index (BMI) 28.0-28.9, adult: Secondary | ICD-10-CM

## 2014-03-11 DIAGNOSIS — E8770 Fluid overload, unspecified: Secondary | ICD-10-CM

## 2014-03-11 DIAGNOSIS — E860 Dehydration: Secondary | ICD-10-CM | POA: Diagnosis present

## 2014-03-11 DIAGNOSIS — B1920 Unspecified viral hepatitis C without hepatic coma: Secondary | ICD-10-CM | POA: Diagnosis present

## 2014-03-11 DIAGNOSIS — R531 Weakness: Secondary | ICD-10-CM | POA: Diagnosis present

## 2014-03-11 DIAGNOSIS — G9340 Encephalopathy, unspecified: Secondary | ICD-10-CM | POA: Diagnosis present

## 2014-03-11 DIAGNOSIS — R627 Adult failure to thrive: Secondary | ICD-10-CM | POA: Diagnosis present

## 2014-03-11 DIAGNOSIS — R05 Cough: Secondary | ICD-10-CM

## 2014-03-11 DIAGNOSIS — E43 Unspecified severe protein-calorie malnutrition: Secondary | ICD-10-CM | POA: Diagnosis present

## 2014-03-11 LAB — CBC WITH DIFFERENTIAL/PLATELET
BASOS PCT: 0 % (ref 0–1)
Basophils Absolute: 0 10*3/uL (ref 0.0–0.1)
EOS ABS: 0 10*3/uL (ref 0.0–0.7)
Eosinophils Relative: 0 % (ref 0–5)
HEMATOCRIT: 34.2 % — AB (ref 39.0–52.0)
Hemoglobin: 11.7 g/dL — ABNORMAL LOW (ref 13.0–17.0)
Lymphocytes Relative: 11 % — ABNORMAL LOW (ref 12–46)
Lymphs Abs: 1.7 10*3/uL (ref 0.7–4.0)
MCH: 26.1 pg (ref 26.0–34.0)
MCHC: 34.2 g/dL (ref 30.0–36.0)
MCV: 76.2 fL — ABNORMAL LOW (ref 78.0–100.0)
MONO ABS: 0.9 10*3/uL (ref 0.1–1.0)
Monocytes Relative: 6 % (ref 3–12)
NEUTROS PCT: 83 % — AB (ref 43–77)
Neutro Abs: 13.4 10*3/uL — ABNORMAL HIGH (ref 1.7–7.7)
Platelets: 280 10*3/uL (ref 150–400)
RBC: 4.49 MIL/uL (ref 4.22–5.81)
RDW: 19.6 % — ABNORMAL HIGH (ref 11.5–15.5)
WBC: 16.1 10*3/uL — ABNORMAL HIGH (ref 4.0–10.5)

## 2014-03-11 LAB — COMPREHENSIVE METABOLIC PANEL WITH GFR
ALT: 23 U/L (ref 0–53)
AST: 98 U/L — ABNORMAL HIGH (ref 0–37)
Albumin: 2.4 g/dL — ABNORMAL LOW (ref 3.5–5.2)
Alkaline Phosphatase: 535 U/L — ABNORMAL HIGH (ref 39–117)
BUN: 110 mg/dL — ABNORMAL HIGH (ref 6–23)
CO2: 16 meq/L — ABNORMAL LOW (ref 19–32)
Calcium: 9.2 mg/dL (ref 8.4–10.5)
Chloride: 93 meq/L — ABNORMAL LOW (ref 96–112)
Creatinine, Ser: 6.72 mg/dL — ABNORMAL HIGH (ref 0.50–1.35)
GFR calc Af Amer: 9 mL/min — ABNORMAL LOW (ref >90)
GFR calc non Af Amer: 8 mL/min — ABNORMAL LOW (ref >90)
Glucose, Bld: 116 mg/dL — ABNORMAL HIGH (ref 70–99)
Potassium: 5.3 meq/L (ref 3.7–5.3)
Sodium: 134 meq/L — ABNORMAL LOW (ref 137–147)
Total Bilirubin: 1.6 mg/dL — ABNORMAL HIGH (ref 0.3–1.2)
Total Protein: 8.6 g/dL — ABNORMAL HIGH (ref 6.0–8.3)

## 2014-03-11 LAB — URINALYSIS, ROUTINE W REFLEX MICROSCOPIC
GLUCOSE, UA: NEGATIVE mg/dL
Hgb urine dipstick: NEGATIVE
Ketones, ur: NEGATIVE mg/dL
Leukocytes, UA: NEGATIVE
Nitrite: NEGATIVE
PH: 5 (ref 5.0–8.0)
Protein, ur: 30 mg/dL — AB
Specific Gravity, Urine: 1.015 (ref 1.005–1.030)
Urobilinogen, UA: 1 mg/dL (ref 0.0–1.0)

## 2014-03-11 LAB — STREP PNEUMONIAE URINARY ANTIGEN: STREP PNEUMO URINARY ANTIGEN: NEGATIVE

## 2014-03-11 LAB — LEGIONELLA ANTIGEN, URINE: Legionella Antigen, Urine: NEGATIVE

## 2014-03-11 LAB — I-STAT CG4 LACTIC ACID, ED: Lactic Acid, Venous: 1.54 mmol/L (ref 0.5–2.2)

## 2014-03-11 LAB — GLUCOSE, CAPILLARY
GLUCOSE-CAPILLARY: 133 mg/dL — AB (ref 70–99)
GLUCOSE-CAPILLARY: 120 mg/dL — AB (ref 70–99)
Glucose-Capillary: 125 mg/dL — ABNORMAL HIGH (ref 70–99)
Glucose-Capillary: 118 mg/dL — ABNORMAL HIGH (ref 70–99)

## 2014-03-11 LAB — LIPASE, BLOOD: LIPASE: 587 U/L — AB (ref 11–59)

## 2014-03-11 LAB — TROPONIN I

## 2014-03-11 LAB — TSH: TSH: 3.3 u[IU]/mL (ref 0.350–4.500)

## 2014-03-11 LAB — PHOSPHORUS: Phosphorus: 7.6 mg/dL — ABNORMAL HIGH (ref 2.3–4.6)

## 2014-03-11 LAB — PRO B NATRIURETIC PEPTIDE: PRO B NATRI PEPTIDE: 31377 pg/mL — AB (ref 0–125)

## 2014-03-11 LAB — CREATININE, URINE, RANDOM: Creatinine, Urine: 157.05 mg/dL

## 2014-03-11 LAB — URINE MICROSCOPIC-ADD ON

## 2014-03-11 LAB — MAGNESIUM: MAGNESIUM: 2.8 mg/dL — AB (ref 1.5–2.5)

## 2014-03-11 LAB — AMMONIA: Ammonia: 27 umol/L (ref 11–60)

## 2014-03-11 LAB — SODIUM, URINE, RANDOM: Sodium, Ur: 23 mEq/L

## 2014-03-11 LAB — PROTIME-INR
INR: 1.17 (ref 0.00–1.49)
PROTHROMBIN TIME: 14.7 s (ref 11.6–15.2)

## 2014-03-11 MED ORDER — DEXTROSE 5 % IV SOLN
500.0000 mg | INTRAVENOUS | Status: DC
  Administered 2014-03-11: 500 mg via INTRAVENOUS
  Filled 2014-03-11: qty 500

## 2014-03-11 MED ORDER — ONDANSETRON HCL 4 MG PO TABS: 4.0000 mg | ORAL_TABLET | Freq: Four times a day (QID) | ORAL | Status: DC | PRN

## 2014-03-11 MED ORDER — ONDANSETRON HCL 4 MG/2ML IJ SOLN: 4.0000 mg | Freq: Four times a day (QID) | INTRAMUSCULAR | Status: DC | PRN

## 2014-03-11 MED ORDER — THIAMINE HCL 100 MG/ML IJ SOLN
100.0000 mg | Freq: Every day | INTRAMUSCULAR | Status: DC
  Filled 2014-03-11: qty 1

## 2014-03-11 MED ORDER — LORAZEPAM 2 MG/ML IJ SOLN: 1.0000 mg | Freq: Four times a day (QID) | INTRAMUSCULAR | Status: DC | PRN

## 2014-03-11 MED ORDER — HYDROMORPHONE HCL PF 2 MG/ML IJ SOLN
2.0000 mg | INTRAMUSCULAR | Status: DC | PRN
  Administered 2014-03-11: 2 mg via INTRAVENOUS
  Filled 2014-03-11: qty 1

## 2014-03-11 MED ORDER — DEXTROSE 5 % IV SOLN: 1.0000 g | INTRAVENOUS | Status: DC

## 2014-03-11 MED ORDER — INSULIN ASPART 100 UNIT/ML ~~LOC~~ SOLN
0.0000 [IU] | Freq: Three times a day (TID) | SUBCUTANEOUS | Status: DC
Start: 2014-03-11 — End: 2014-03-11
  Administered 2014-03-11 (×2): 1 [IU] via SUBCUTANEOUS

## 2014-03-11 MED ORDER — FOLIC ACID 1 MG PO TABS
1.0000 mg | ORAL_TABLET | Freq: Every day | ORAL | Status: DC
  Filled 2014-03-11: qty 1

## 2014-03-11 MED ORDER — SODIUM CHLORIDE 0.9 % IV SOLN
1.0000 mg/h | INTRAVENOUS | Status: DC
  Administered 2014-03-11 – 2014-03-12 (×2): 0.5 mg/h via INTRAVENOUS
  Filled 2014-03-11 (×2): qty 2.5

## 2014-03-11 MED ORDER — MORPHINE SULFATE 2 MG/ML IJ SOLN
2.0000 mg | INTRAMUSCULAR | Status: DC | PRN
  Administered 2014-03-11: 2 mg via INTRAVENOUS
  Filled 2014-03-11: qty 1

## 2014-03-11 MED ORDER — OXYCODONE HCL 5 MG PO TABS: 5.0000 mg | ORAL_TABLET | ORAL | Status: DC | PRN

## 2014-03-11 MED ORDER — DOCUSATE SODIUM 100 MG PO CAPS
100.0000 mg | ORAL_CAPSULE | Freq: Two times a day (BID) | ORAL | Status: DC
  Filled 2014-03-11 (×2): qty 1

## 2014-03-11 MED ORDER — SODIUM CHLORIDE 0.9 % IV SOLN
INTRAVENOUS | Status: DC
Start: 2014-03-11 — End: 2014-03-12
  Administered 2014-03-11 – 2014-03-12 (×2): via INTRAVENOUS

## 2014-03-11 MED ORDER — VITAMIN B-1 100 MG PO TABS
100.0000 mg | ORAL_TABLET | Freq: Every day | ORAL | Status: DC
  Filled 2014-03-11: qty 1

## 2014-03-11 MED ORDER — ATROPINE SULFATE 1 % OP SOLN
2.0000 [drp] | OPHTHALMIC | Status: DC | PRN
  Administered 2014-03-12: 2 [drp] via SUBLINGUAL
  Administered 2014-03-12: 4 [drp] via SUBLINGUAL
  Filled 2014-03-11: qty 2

## 2014-03-11 MED ORDER — SENNOSIDES-DOCUSATE SODIUM 8.6-50 MG PO TABS: 1.0000 | ORAL_TABLET | Freq: Every evening | ORAL | Status: DC | PRN

## 2014-03-11 MED ORDER — LORAZEPAM 2 MG/ML IJ SOLN: 1.0000 mg | INTRAMUSCULAR | Status: DC | PRN

## 2014-03-11 MED ORDER — LORAZEPAM 1 MG PO TABS: 1.0000 mg | ORAL_TABLET | Freq: Four times a day (QID) | ORAL | Status: DC | PRN

## 2014-03-11 MED ORDER — ADULT MULTIVITAMIN W/MINERALS CH
1.0000 | ORAL_TABLET | Freq: Every day | ORAL | Status: DC
  Filled 2014-03-11: qty 1

## 2014-03-11 MED ORDER — SODIUM CHLORIDE 0.9 % IJ SOLN
3.0000 mL | Freq: Two times a day (BID) | INTRAMUSCULAR | Status: DC
Start: 2014-03-11 — End: 2014-03-12

## 2014-03-11 MED ORDER — ALBUTEROL SULFATE (2.5 MG/3ML) 0.083% IN NEBU: 2.5000 mg | INHALATION_SOLUTION | RESPIRATORY_TRACT | Status: DC | PRN

## 2014-03-11 MED ORDER — OXYCODONE HCL 5 MG PO TABS
5.0000 mg | ORAL_TABLET | ORAL | Status: DC | PRN
  Administered 2014-03-11: 10 mg via ORAL
  Filled 2014-03-11: qty 2

## 2014-03-11 MED ORDER — HYDROMORPHONE BOLUS VIA INFUSION
1.0000 mg | INTRAVENOUS | Status: DC | PRN
Start: 2014-03-11 — End: 2014-03-12
  Administered 2014-03-12 (×8): 1 mg via INTRAVENOUS
  Filled 2014-03-11: qty 1

## 2014-03-11 MED ORDER — LORAZEPAM 2 MG/ML IJ SOLN: 1.0000 mg | Freq: Two times a day (BID) | INTRAMUSCULAR | Status: DC

## 2014-03-11 NOTE — Progress Notes (Signed)
Nutrition Brief Note  Chart reviewed.  Patient has not been eating at home for the past 2 months.  CHO MOD Heart Healthy diet currently with no intake.  Pt now transitioning to comfort care.  No further nutrition interventions warranted at this time.  Please consult as needed.   Antonieta Iba, RD, LDN Clinical Inpatient Dietitian Pager:  [phone] Weekend and after hours pager:  [phone]

## 2014-03-11 NOTE — Progress Notes (Signed)
Clinical Social Work Department BRIEF PSYCHOSOCIAL ASSESSMENT 03/04/2014  Patient:  John Casey,John Casey     Account Number:  [id number]     Admit date:  03/14/2014  Clinical Social Worker:  Earlie Server  Date/Time:  03/04/2014 03:15 PM  Referred by:  Physician  Date Referred:  03/16/2014 Referred for  Residential hospice placement   Other Referral:   Interview type:  Family Other interview type:    PSYCHOSOCIAL DATA Living Status:  FAMILY Admitted from facility:   Level of care:   Primary support name:  Neoma Laming Primary support relationship to patient:  SIBLING Degree of support available:   Strong    [phone] or [phone]    CURRENT CONCERNS Current Concerns  Post-Acute Placement   Other Concerns:    SOCIAL WORK ASSESSMENT / PLAN CSW received referral in order to offer hospice choice. CSW went to room but patient is unable to participate in assessment. CSW spoke with sister Neoma Laming) via phone. CSW introduced myself and explained role.    Patient's sister reports that patient is not doing well and she is concerned about patient's DC plans. CSW explained role and how CSW could assist if family was interested in hospice placement. Sister reports that they would be interested in [facility] but want to make sure patient is not going to pass away in the hospital or in transition to hospice. CSW explained that CSW will make referral to [facility] but MD will continue to follow to determine if patient is able to transfer.    CSW spoke with [facility] who is aware of family's interest. CSW will continue to follow.   Assessment/plan status:  Psychosocial Support/Ongoing Assessment of Needs Other assessment/ plan:   Information/referral to community resources:   Hospice choice    PATIENT'S/FAMILY'S RESPONSE TO PLAN OF CARE: Patient unable to participate in assessment. Sister reports she is trying to be supportive and help patient as much as she can. Sister states she  has spent lots of time with patient but needed a break from the hospital. Sister has CSW contact information and will get hospice list from room. After discussing hospice choice via phone, sister is interested in [facility] so she can visit often. Sister engaged and thanked CSW for assistance.       [place], [place] [phone]

## 2014-03-11 NOTE — Progress Notes (Signed)
Clinical Social Work  CSW received referral due to substance use. Per chart review, patient has a history of use and main focus is palliative measures. CSW will follow up and complete full assessment after [place] meeting has occurred and will assist with needs.  Kennan, Pulcifer [phone]

## 2014-03-11 NOTE — Consult Note (Signed)
@LOGODEPT @ Palliative Medicine Team at [facility]  Date: 03/14/2014   Patient Name: John Casey  DOB: 23-Dec-1950  MRN: [id number]  Age / Sex: 63 y.o., male   PCP: Lucianne Lei, MD Referring Physician: Kelvin Cellar, MD  Active Problems: Primary Problem:   Liver Cancer, end stage   Acute renal failure-probable hepato-renal   Malnutrition Active Problems:   COPD (chronic obstructive pulmonary disease)   Liver cancer   Possible CAP (community acquired pneumonia)   Pancreatitis   Metabolic acidosis   Multiple falls   Weakness   DM (diabetes mellitus), type 2 with renal complications   Acute systolic heart failure   Hypertension  HPI/Reason for Consultation: Hoel is a 63 y.o. male presenting with altered mental status found to have a diagnosis of liver cancer from March 2015 and was scheduled to see oncology on 6/22 but did not make it to this appointment due to hospitalization. He has declined significantly in the past month- weight loss, frequent falls. On admission his creatine was >6, he was encephalopathic and was having worsening dyspnea. PMT consulted for goals of care.  Participants in Discussion: Patient minimally responsive. Sister Neoma Laming at [employer] being primary Media planner and caregiver.  Goals/Summary of Case:   Advance Directive: None on file   Code Status Orders        Start     Ordered   02/28/2014 0631  Do not attempt resuscitation (DNR)   Continuous     03/03/2014 0631      I have reviewed the medical record, interviewed the patient and family, and examined the patient. The following aspects are pertinent.  Past Medical History  Diagnosis Date  . COPD (chronic obstructive pulmonary disease)   . Diabetes mellitus   . Hypertension   . Pulmonary nodules   . Liver cancer     History   Social History  . Marital Status: Single    Spouse Name: N/A    Number of Children: N/A  . Years of Education: N/A   Occupational History  .  disabled    Social History Main Topics  . Smoking status: Current Every Day Smoker -- 0.25 packs/day for 38 years    Types: Cigarettes  . Smokeless tobacco: Never Used     Comment: 5 cigs per day 02/11/13  . Alcohol Use: No  . Drug Use: No  . Sexual Activity: None   Other Topics Concern  . None   Social History Narrative  . None    Family History  Problem Relation Age of Onset  . Heart disease Father    Scheduled Meds: . cefTRIAXone (ROCEPHIN)  IV  1 g Intravenous Q24H  . LORazepam  1 mg Intravenous q12n4p  . sodium chloride  3 mL Intravenous Q12H  . thiamine  100 mg Intravenous Daily   Continuous Infusions: . sodium chloride 60 mL/hr at 03/17/2014 1116  . HYDROmorphone     PRN Meds:.albuterol, HYDROmorphone, LORazepam, ondansetron (ZOFRAN) IV No Known Allergies CBC:    Component Value Date/Time   WBC 16.1* 03/17/2014 0245   HGB 11.7* 03/20/2014 0245   HCT 34.2* 03/09/2014 0245   PLT 280 03/18/2014 0245   MCV 76.2* 03/20/2014 0245   NEUTROABS 13.4* 03/03/2014 0245   LYMPHSABS 1.7 02/25/2014 0245   MONOABS 0.9 03/07/2014 0245   EOSABS 0.0 03/20/2014 0245   BASOSABS 0.0 02/28/2014 0245    Comprehensive Metabolic Panel:    Component Value Date/Time   NA 134* 03/11/2014 0245   K  5.3 03/05/2014 0245   CL 93* 03/18/2014 0245   CO2 16* 03/17/2014 0245   BUN 110* 03/23/2014 0245   CREATININE 6.72* 03/03/2014 0245   GLUCOSE 116* 03/22/2014 0245   CALCIUM 9.2 03/20/2014 0245   AST 98* 03/20/2014 0245   ALT 23 03/04/2014 0245   ALKPHOS 535* 03/18/2014 0245   BILITOT 1.6* 03/05/2014 0245   PROT 8.6* 03/08/2014 0245   ALBUMIN 2.4* 03/10/2014 0245   Vital Signs: BP 109/64  Pulse 91  Temp(Src) 97.2 F (36.2 C) (Oral)  Resp 16  Ht 5\' 11"  (1.803 m)  Wt 91.1 kg (200 lb 13.4 oz)  BMI 28.02 kg/m2  SpO2 98% Filed Weights   03/11/14 0637  Weight: 91.1 kg (200 lb 13.4 oz)   Physical Exam:  General appearance: groaning, unresponsive Eyes: slightly icteric sclerae, moist conjunctivae; +  lid-lag; HENT: Poor dentition Neck: Trachea midline; FROM, supple, no thyromegaly or lymphadenopathy Lungs: +wheezing and course rhonchi CV: tachy Abdomen: Distended Extremities: + peripheral edema  Skin: Normal temperature, turgor and texture; no rash, ulcers or subcutaneous nodules Psych: ontunded  Assessment/Prognosis: Primary Diagnoses  63 yo man actively dying of Liver Cancer- he is obtunded, moaning and dyspneic.he has had a 50lb weight loss in past year. He is not a candidate for systemic treatment and requested comfort care- his sister told me that the patient ha not wanted to even come to the hospital-he wanted treatment of his pain and suffering- he was worried about who would take care of him if he got sicker. Minimal PO intake today. This appears to be a dramatic decline today.  Active Symptoms: Pain-abdominal Dyspnea  Prognosis: hours-days  PPS 10   Scope of Treatment / recommendations:  1. DNR 2. Full comfort 3. Started him on a low dose Dilaudid infusion for pain and discomfort 4. Ativan scheduled and PRN for ETOH withdrawal risk and agitation 5. Minimize painful interventions   Psycho/social/Spritual:. Chaplan Consult  Palliative Prophylaxis:   Bowel Regimen   Terminal Secretions  Breakthrough Pain and Dyspnea   Agitation and Delirium  Nausea  Psychosocial Spiritual Asssessment/Interventions:  Patient and Family Adjustment to Illness/Prognosis: coping well-sister seems to genuinely care for him and his comfort  Spiritual Concerns or Needs: none expressed   Disposition: Anticipate a hospital death (soon). Will request hospice facility placement if he is stable enough to be transferred for EOL care and symptom management.   Time: 50 minutes 2PM-2:50PM Greater than 50%  of this time was spent counseling and coordinating care related to the above assessment and plan.  Signed by: Roma Schanz, DO  03/11/2014, 2:28  PM  Please contact Palliative Medicine Team phone at [phone] for questions and concerns.

## 2014-03-11 NOTE — ED Provider Notes (Signed)
CSN: [id number]     Arrival date & time 03/01/2014  0050 History   First MD Initiated Contact with Patient 03/23/2014 0106     Chief Complaint  Patient presents with  . Fall  . Weakness     (Consider location/radiation/quality/duration/timing/severity/associated sxs/prior Treatment) HPI Patient has a history of COPD and liver cancer. He lives alone with his niece occasionally taking care of him. He presents with generalized weakness and multiple falls for the past few weeks. If also have been increasing in frequency. The patient fell tonight. He states he did not hit his head or neck. Questionable loss of consciousness. He has ongoing chronic low back pain. This is unchanged. He denies any focal weakness or numbness. He denies any incontinence. Past Medical History  Diagnosis Date  . COPD (chronic obstructive pulmonary disease)   . Diabetes mellitus   . Hypertension   . Pulmonary nodules   . Liver cancer    History reviewed. No pertinent past surgical history. Family History  Problem Relation Age of Onset  . Heart disease Father    History  Substance Use Topics  . Smoking status: Current Every Day Smoker -- 0.25 packs/day for 38 years    Types: Cigarettes  . Smokeless tobacco: Never Used     Comment: 5 cigs per day 02/11/13  . Alcohol Use: No    Review of Systems  Constitutional: Positive for fatigue. Negative for fever and chills.  Respiratory: Positive for cough and shortness of breath.   Cardiovascular: Negative for chest pain.  Gastrointestinal: Positive for abdominal pain, diarrhea and abdominal distention. Negative for nausea, vomiting and constipation.  Genitourinary: Negative for dysuria and flank pain.  Musculoskeletal: Positive for back pain. Negative for neck pain and neck stiffness.  Skin: Negative for rash and wound.  Neurological: Positive for syncope. Negative for dizziness, weakness, light-headedness, numbness and headaches.  All other systems reviewed and are  negative.     Allergies  Review of patient's allergies indicates no known allergies.  Home Medications   Prior to Admission medications   Medication Sig Start Date End Date Taking? Authorizing Provider  aspirin (ASPIRIN CHILDRENS) 81 MG chewable tablet Chew 81 mg by mouth daily.   Yes Historical Provider, MD  cetirizine (ZYRTEC) 10 MG tablet Take 10 mg by mouth daily.   Yes Historical Provider, MD  cloNIDine (CATAPRES) 0.1 MG tablet Take 0.1 mg by mouth 2 (two) times daily.   Yes Historical Provider, MD  gabapentin (NEURONTIN) 300 MG capsule Take 600 mg by mouth 3 (three) times daily.    Yes Historical Provider, MD  indapamide (LOZOL) 1.25 MG tablet Take 1.25 mg by mouth daily.   Yes Historical Provider, MD  Linagliptin-Metformin HCl (JENTADUETO) 2.01-999 MG TABS Take 1 tablet by mouth 2 (two) times daily.   Yes Historical Provider, MD  oxyCODONE (ROXICODONE) 5 MG immediate release tablet Take 1-2 tablets (5-10 mg total) by mouth every 4 (four) hours as needed for severe pain or breakthrough pain. 02/25/14  Yes Ephraim Hamburger, MD   BP 123/69  Pulse 99  Temp(Src) 97.8 F (36.6 C) (Oral)  Resp 14  SpO2 99% Physical Exam  Nursing note and vitals reviewed. Constitutional: He is oriented to person, place, and time. He appears well-developed and well-nourished. No distress.  Chronically unwell-appearing  HENT:  Head: Normocephalic and atraumatic.  Dry lips and the oral mucosa  Eyes: EOM are normal. Pupils are equal, round, and reactive to light.  Neck: Normal range of motion. Neck  supple.  No posterior midline cervical tenderness to palpation. Patient has full range of motion of the neck.  Cardiovascular: Normal rate and regular rhythm.  Exam reveals no gallop and no friction rub.   No murmur heard. Pulmonary/Chest: Effort normal and breath sounds normal. No respiratory distress. He has no wheezes. He has no rales. He exhibits no tenderness.  Abdominal: Soft. Bowel sounds are normal.  He exhibits distension. He exhibits no mass. There is tenderness (diffuse tenderness without focality). There is no rebound and no guarding.  Musculoskeletal: Normal range of motion. He exhibits edema (3+ pitting edema to knees bilaterally.) and tenderness (tender to palpation in the midline lumbar region. There is no step-offs or evidence of trauma.).  Neurological: He is alert and oriented to person, place, and time.  4/5 motor in bilateral lower extremities. Good grip strength bilaterally.  Skin: Skin is warm and dry. No rash noted. No erythema.  Psychiatric: He has a normal mood and affect. His behavior is normal.    ED Course  Procedures (including critical care time) Labs Review Labs Reviewed  CBC WITH DIFFERENTIAL - Abnormal; Notable for the following:    WBC 16.1 (*)    Hemoglobin 11.7 (*)    HCT 34.2 (*)    MCV 76.2 (*)    RDW 19.6 (*)    Neutrophils Relative % 83 (*)    Neutro Abs 13.4 (*)    Lymphocytes Relative 11 (*)    All other components within normal limits  CULTURE, BLOOD (ROUTINE X 2)  CULTURE, BLOOD (ROUTINE X 2)  PROTIME-INR  COMPREHENSIVE METABOLIC PANEL  PRO B NATRIURETIC PEPTIDE  TROPONIN I  LIPASE, BLOOD  URINALYSIS, ROUTINE W REFLEX MICROSCOPIC  AMMONIA  I-STAT CG4 LACTIC ACID, ED    Imaging Review Dg Chest Port 1 View  03/19/2014   CLINICAL DATA:  Hypoxia.  EXAM: PORTABLE CHEST - 1 VIEW  COMPARISON:  Chest radiograph and CT of the chest performed 05/09/2011  FINDINGS: The lungs are hypoexpanded. Vascular crowding and vascular congestion are seen. Increased interstitial markings and left basilar airspace opacities may reflect pulmonary edema or pneumonia. A small left pleural effusion is suspected. No pneumothorax is seen.  The cardiomediastinal silhouette is borderline normal in size. No acute osseous abnormalities are identified.  IMPRESSION: Lungs hypoexpanded. Vascular congestion noted. Increased interstitial markings and left basilar airspace  opacities may reflect pulmonary edema or pneumonia. Suspect small left pleural effusion.   Electronically Signed   By: Garald Balding M.D.   On: 02/24/2014 03:00     EKG Interpretation None      MDM   Final diagnoses:  None   Discussed with Dr. Conley Canal who will see patient in emergency department and admit.     Julianne Rice, MD 03/09/2014 [phone]

## 2014-03-11 NOTE — Progress Notes (Signed)
[facility] PROGRESS NOTE  John Casey ZOX:[id number] DOB: 1951/03/10 DOA: 03/17/2014 PCP: Elyn Peers, MD  Assessment/Plan: 1. Failure to thrive/functional decline -Likely multifactorial as progression of underlying malignancy, infection, acute renal failure, dehydration, and his multiple comorbidities all likely contributors -Providing gentle IV fluid resuscitation with normal saline at 60 mL's per hour and empiric antibiotic therapy with ceftriaxone 1 g IV every 24 hours and azithromycin 500 mg IV every 24 hour  2.  Suspected community acquire pneumonia -Patient presenting with a functional decline, leukocytosis, as chest x-ray showed increased interstitial markings and left basilar airspace opacities that could reflect pneumonia -Continue empiric IV antimicrobial therapy with azithromycin and ceftriaxone  3.  Acute renal failure -Initial labs revealing a creatinine of 6.72, significantly increased from 0.73 on 02/25/2014 -Likely secondary to hypovolemia/dehydration, patient had been on diuretic therapy prior to this hospitalization and likely worsened by minimal by mouth intake -Providing gentle IV fluid hydration with normal saline at 75 ML's per hour  4.  History of systolic congestive heart failure -Appears dehydrated/volume depleted on exam, with lab showing acute kidney injury having a creatinine of 6.72. -BNP was elevated at 31,377 -Providing gentle IV fluid resuscitation with normal saline at 60 mL/hour and holding diuretic therapy  5. History of liver cancer. -CT scan of abdomen and pelvis that was done on 12/02/2013 showed extensive infiltrating lesions throughout the right hepatic lobe, findings compatible with infiltrative hepatocellular carcinoma -He had an upcoming appointment with medical oncology  6. Goals of care -I discussed goals of care with patient and with his sister Neoma Laming. Patient clearly expressed his desire to focus his care on comfort. His sister  Neoma Laming fully supported this decision, stating that they had reached out to hospice prior to this hospitalization and where waiting to hear back from them. Family interested in inpatient hospice services. Palliative care was consulted.   Code Status: DNR Family Communication: I spoke with his sister over telephone conversation Disposition Plan: Family interested in inpatient hospice   Consultants:  Palliative care   Antibiotics:  Rocephin  Azithromycin  HPI/Subjective: Patient is a pleasant 63 year old gentleman with multiple comorbidities, as a CT scan of abdomen performed on 12/02/2013 showed extensive infiltrating lesions throughout the right hepatic lobe largest measuring 8.7 cm most compatible with infiltrative hepatocellular carcinoma. He presented to the emergency room on 03/10/2014 with complaints of functional decline, failure to thrive, generalized weakness and having recurrent falls. He had an upcoming appointment with Dr. Benay Spice of medical oncology.   Objective: Filed Vitals:   03/23/2014 0637  BP: 116/59  Pulse: 96  Temp: 97.5 F (36.4 C)  Resp: 16    Intake/Output Summary (Last 24 hours) at 03/21/2014 1048 Last data filed at 03/20/2014 0434  Gross per 24 hour  Intake      0 ml  Output     10 ml  Net    -10 ml   Filed Weights   03/19/2014 8119  Weight: 91.1 kg (200 lb 13.4 oz)    Exam:   General:  Ill appearing, cachectic, reporting back pain  Cardiovascular: Regular rate and rhythm, normal S1S2  Respiratory: Coarse respiratory sounds, bibasilar crackles.   Abdomen: Mild to moderate generalized pain to deep palpation   Data Reviewed: Basic Metabolic Panel:  Recent Labs Lab 03/10/2014 0245 03/21/2014 0715  NA 134*  --   K 5.3  --   CL 93*  --   CO2 16*  --   GLUCOSE 116*  --   BUN 110*  --  CREATININE 6.72*  --   CALCIUM 9.2  --   MG  --  2.8*  PHOS  --  7.6*   Liver Function Tests:  Recent Labs Lab 03/24/2014 0245  AST 98*  ALT 23   ALKPHOS 535*  BILITOT 1.6*  PROT 8.6*  ALBUMIN 2.4*    Recent Labs Lab 03/07/2014 0245  LIPASE 587*    Recent Labs Lab 02/26/2014 0230  AMMONIA 27   CBC:  Recent Labs Lab 03/21/2014 0245  WBC 16.1*  NEUTROABS 13.4*  HGB 11.7*  HCT 34.2*  MCV 76.2*  PLT 280   Cardiac Enzymes:  Recent Labs Lab 02/24/2014 0245  TROPONINI <0.30   BNP (last 3 results)  Recent Labs  03/14/2014 0308  PROBNP 31377.0*   CBG:  Recent Labs Lab 03/21/2014 0722  GLUCAP 133*    No results found for this or any previous visit (from the past 240 hour(s)).   Studies: US Renal  03/02/2014   CLINICAL DATA:  Acute renal failure.  Known liver cancer.  EXAM: RENAL/URINARY TRACT ULTRASOUND COMPLETE  COMPARISON:  Ultrasound, 02/25/2014.  FINDINGS: Right Kidney:  Length: 11.7 cm. 2.6 cm mass lies along the periphery of the right kidney, along its anterior lateral aspect, adjacent to the midpole. This was not evident on the prior renal ultrasound or previous CT. This is most likely not of renal origin, more likely either retroperitoneal metastatic disease or direct extension of neoplastic disease from the liver. Normal parenchymal echogenicity. No hydronephrosis.  Left Kidney:  Length: 11.6 cm. Echogenicity within normal limits. No mass or hydronephrosis visualized.  Bladder:  Appears normal for degree of bladder distention.  Small amount of ascites.  IMPRESSION: 1. No hydronephrosis. 2. 2.6 cm soft tissue mass along the periphery of the right kidney. 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 3. Kidneys otherwise unremarkable.   Electronically Signed   By: Lajean Manes M.D.   On: 03/04/2014 10:27   Dg Chest Port 1 View  03/07/2014   CLINICAL DATA:  Hypoxia.  EXAM: PORTABLE CHEST - 1 VIEW  COMPARISON:  Chest radiograph and CT of the chest performed  05/09/2011  FINDINGS: The lungs are hypoexpanded. Vascular crowding and vascular congestion are seen. Increased interstitial markings and left basilar airspace opacities may reflect pulmonary edema or pneumonia. A small left pleural effusion is suspected. No pneumothorax is seen.  The cardiomediastinal silhouette is borderline normal in size. No acute osseous abnormalities are identified.  IMPRESSION: Lungs hypoexpanded. Vascular congestion noted. Increased interstitial markings and left basilar airspace opacities may reflect pulmonary edema or pneumonia. Suspect small left pleural effusion.   Electronically Signed   By: Garald Balding M.D.   On: 03/14/2014 03:00    Scheduled Meds: . azithromycin  500 mg Intravenous Q24H  . cefTRIAXone (ROCEPHIN)  IV  1 g Intravenous Q24H  . docusate sodium  100 mg Oral BID  . folic acid  1 mg Oral Daily  . insulin aspart  0-9 Units Subcutaneous TID WC  . multivitamin with minerals  1 tablet Oral Daily  . sodium chloride  3 mL Intravenous Q12H  . thiamine  100 mg Oral Daily   Or  . thiamine  100 mg Intravenous Daily   Continuous Infusions:   Principal Problem:   Acute renal failure Active Problems:   COPD (chronic obstructive  pulmonary disease)   Liver cancer   possible CAP (community acquired pneumonia)   Pancreatitis   Metabolic acidosis   Multiple falls   Weakness   DM (diabetes mellitus), type 2 with renal complications   Acute systolic heart failure   Hypertension    Time spent: 35 min    Kelvin Cellar  [facility] Pager [phone]. If 7PM-7AM, please contact night-coverage at [email or web address], password [facility] 03/21/2014, 10:48 AM  LOS: 0 days

## 2014-03-11 NOTE — ED Notes (Signed)
Bed: GE36 Expected date:  Expected time:  Means of arrival:  Comments: EMS 63yo M, weakness / multiple falls / Liver CA

## 2014-03-11 NOTE — ED Notes (Signed)
Per EMS: Pt has had increasing number of falls recently d/t weakness. Pt recently diagnosed with liver cancer, has appointment with doctor this week to discuss treatment. Pt fell tonight, believes he lost consciousness. Denies any neck, head, or back pain. Pain in abdomen r/t liver cancer. Pt states he falls whenever he tries to stand up. Decreased appetite.

## 2014-03-11 NOTE — H&P (Signed)
[facility] History and Physical  John Casey YQI:[id number] DOB: 07/29/1951 DOA: 03/03/2014  Referring physician: Lita Mains PCP: Elyn Peers, MD   Chief Complaint: falls  HPI: John Casey is a 63 y.o. male  With a history of liver cancer, alcohol abuse and hepatitis C, diabetes, hypertension, COPD presents with progressive weakness and multiple falls. He is a poor historian but apparently lives at home alone. He has not yet been seen by Dr. Benay Spice, appointment is scheduled next week. He has poor appetite. He has chronic leg edema. He has increasing abdominal distention. His appetite is poor. He is making urine, but not much. In the emergency room, multiple abnormalities are found. Chest x-ray shows pulmonary edema versus pneumonia. White blood cell count is 16,000. Bicarbonate is 16. BUN is 110. Creatinine 6.72. Pro BNP greater than 30,000. Echocardiogram from 2003 as difficult study but showed an ejection fraction of approximately 30-35%. Patient denies any vomiting. He denies dyspnea or chest pain.   Review of Systems:  Difficult due to patient factors.  Past Medical History  Diagnosis Date  . COPD (chronic obstructive pulmonary disease)   . Diabetes mellitus   . Hypertension   . Pulmonary nodules   . Liver cancer    History reviewed. No pertinent past surgical history. Social History:  reports that he has been smoking Cigarettes.  He has a 9.5 pack-year smoking history. He has never used smokeless tobacco. He reports that he does not drink alcohol or use illicit drugs.  No Known Allergies  Family History  Problem Relation Age of Onset  . Heart disease Father      Prior to Admission medications   Medication Sig Start Date End Date Taking? Authorizing Provider  aspirin (ASPIRIN CHILDRENS) 81 MG chewable tablet Chew 81 mg by mouth daily.   Yes Historical Provider, MD  cetirizine (ZYRTEC) 10 MG tablet Take 10 mg by mouth daily.   Yes Historical Provider, MD    cloNIDine (CATAPRES) 0.1 MG tablet Take 0.1 mg by mouth 2 (two) times daily.   Yes Historical Provider, MD  gabapentin (NEURONTIN) 300 MG capsule Take 600 mg by mouth 3 (three) times daily.    Yes Historical Provider, MD  indapamide (LOZOL) 1.25 MG tablet Take 1.25 mg by mouth daily.   Yes Historical Provider, MD  Linagliptin-Metformin HCl (JENTADUETO) 2.01-999 MG TABS Take 1 tablet by mouth 2 (two) times daily.   Yes Historical Provider, MD  oxyCODONE (ROXICODONE) 5 MG immediate release tablet Take 1-2 tablets (5-10 mg total) by mouth every 4 (four) hours as needed for severe pain or breakthrough pain. 02/25/14  Yes Ephraim Hamburger, MD   Physical Exam: Filed Vitals:   03/10/2014 0500  BP: 113/67  Pulse: 107  Temp:   Resp: 18    BP 113/67  Pulse 107  Temp(Src) 97.8 F (36.6 C) (Oral)  Resp 18  SpO2 97%  BP 113/67  Pulse 107  Temp(Src) 97.8 F (36.6 C) (Oral)  Resp 18  SpO2 97%  General Appearance:    chronically ill-appearing African American male. Appropriate but slightly forgetful.   Head:    Normocephalic, without obvious abnormality, atraumatic, bitemporal wasting  Eyes:    PERRL, conjunctiva/corneas clear, EOM's intact, fundi    benign, both eyes          Nose:   Nares normal, septum midline, mucosa normal, no drainage   or sinus tenderness  Throat:   dry mucous membranes. Oropharynx without erythema or exudate   Neck:  Supple, no thyromegaly. Distended neck veins.   Back:     Symmetric, no curvature, ROM normal, no CVA tenderness  Lungs:     diminished throughout without wheezes rhonchi or rales   Chest wall:    No tenderness or deformity  Heart:    Regular rate and rhythm, S1 and S2 normal, no murmur, rub   or gallop  Abdomen:     is distended with shifting dullness and caput medusa  Genitalia:   deferred   Rectal:   deferred   Extremities:   3+ pitting edema   Pulses:   2+ and symmetric all extremities  Skin:   Skin color, texture, turgor normal, no rashes or  lesions  Lymph nodes:   Cervical, supraclavicular, and axillary nodes normal  Neurologic:   CNII-XII intact. globally weak without focal weakness. Tremor is present           Labs on Admission:  Basic Metabolic Panel:  Recent Labs Lab 02/24/2014 0245  NA 134*  K 5.3  CL 93*  CO2 16*  GLUCOSE 116*  BUN 110*  CREATININE 6.72*  CALCIUM 9.2   Liver Function Tests:  Recent Labs Lab 03/03/2014 0245  AST 98*  ALT 23  ALKPHOS 535*  BILITOT 1.6*  PROT 8.6*  ALBUMIN 2.4*    Recent Labs Lab 03/18/2014 0245  LIPASE 587*    Recent Labs Lab 02/28/2014 0230  AMMONIA 27   CBC:  Recent Labs Lab 03/19/2014 0245  WBC 16.1*  NEUTROABS 13.4*  HGB 11.7*  HCT 34.2*  MCV 76.2*  PLT 280   Cardiac Enzymes:  Recent Labs Lab 03/23/2014 0245  TROPONINI <0.30    BNP (last 3 results)  Recent Labs  03/24/2014 0308  PROBNP 31377.0*   CBG: No results found for this basename: GLUCAP,  in the last 168 hours  Radiological Exams on Admission: Dg Chest Port 1 View  03/15/2014   CLINICAL DATA:  Hypoxia.  EXAM: PORTABLE CHEST - 1 VIEW  COMPARISON:  Chest radiograph and CT of the chest performed 05/09/2011  FINDINGS: The lungs are hypoexpanded. Vascular crowding and vascular congestion are seen. Increased interstitial markings and left basilar airspace opacities may reflect pulmonary edema or pneumonia. A small left pleural effusion is suspected. No pneumothorax is seen.  The cardiomediastinal silhouette is borderline normal in size. No acute osseous abnormalities are identified.  IMPRESSION: Lungs hypoexpanded. Vascular congestion noted. Increased interstitial markings and left basilar airspace opacities may reflect pulmonary edema or pneumonia. Suspect small left pleural effusion.   Electronically Signed   By: Garald Balding M.D.   On: 03/21/2014 03:00    EKG: Sinus rhythm Low voltage, extremity leads Minimal ST elevation, anterior leads  Assessment/Plan Principal Problem:   Acute  renal failure etiology not clear. Will check fractional excretion of sodium. Renal ultrasound.  Does not appear to be a candidate for hemodialysis due to liver cancer and multiple other medical issues. Active Problems:   COPD (chronic obstructive pulmonary disease): No wheezing currently. Bronchodilators as needed.   Liver cancer: Has not yet seen oncology, but doubt patient is a candidate for chemotherapy given his multiple medical problems   possible CAP (community acquired pneumonia): Will give Rocephin and azithromycin.   Pancreatitis by enzymes, though no reported epigastric pain or vomiting. Diet as tolerated.   Metabolic acidosis likely from renal failure and acute illness   Multiple falls: Will likely need an alternate living situation.   Weakness   DM (diabetes mellitus), type 2  with renal complications: Stop metformin. Give sliding scale only for now.   Acute systolic heart failure: Hesitant to give diuretics in the setting of acute renal failure. Will check an echocardiogram.   Hypertension: Hold antihypertensives. History hepatitis C History of alcohol abuse: Will place on CIWA protocol.  In summary, prognosis appears very poor; likely not a candidate for chemotherapy or hemodialysis. He is unable to care for himself at home. He would be hospice appropriate. He reports that he would not want to be resuscitated. Will get a palliative care consult for goals of care.  Code Status: DNR Family Communication: none available Disposition Plan: ?  Time spent: 90 minutes  [facility] Pager [phone]

## 2014-03-12 DIAGNOSIS — Z515 Encounter for palliative care: Secondary | ICD-10-CM

## 2014-03-12 DIAGNOSIS — I5021 Acute systolic (congestive) heart failure: Secondary | ICD-10-CM

## 2014-03-12 DIAGNOSIS — N179 Acute kidney failure, unspecified: Secondary | ICD-10-CM

## 2014-03-12 DIAGNOSIS — E872 Acidosis, unspecified: Secondary | ICD-10-CM

## 2014-03-12 DIAGNOSIS — E1129 Type 2 diabetes mellitus with other diabetic kidney complication: Secondary | ICD-10-CM

## 2014-03-12 DIAGNOSIS — R627 Adult failure to thrive: Secondary | ICD-10-CM

## 2014-03-12 DIAGNOSIS — J189 Pneumonia, unspecified organism: Secondary | ICD-10-CM

## 2014-03-12 DIAGNOSIS — N19 Unspecified kidney failure: Secondary | ICD-10-CM

## 2014-03-12 DIAGNOSIS — C229 Malignant neoplasm of liver, not specified as primary or secondary: Secondary | ICD-10-CM

## 2014-03-16 ENCOUNTER — Ambulatory Visit: Payer: Self-pay

## 2014-03-16 ENCOUNTER — Ambulatory Visit: Payer: Self-pay | Admitting: Oncology

## 2014-03-17 LAB — CULTURE, BLOOD (ROUTINE X 2)
Culture: NO GROWTH
Culture: NO GROWTH

## 2014-03-25 NOTE — Progress Notes (Addendum)
Palliative Medicine Team Progress Note  Patient's niece John Casey arrived with [facility] paperwork, she apparently pulled out patient's IV and demanded patient received hemodialysis and full scope treatment. She has not been available at bedside, and yesterday patient's sister John Casey reported that he had a niece who is having a hard time accepting his situation. John Casey did not indicate that he had a formal HCPOA. Nurses and primary physician team worked her through the panic and active grief and when I arrived to the room the goals for comfort had been re-established and chaplain had been called. IV re-started along with Dilaudid infusion and atropine gtts given. Chaplain Ree Edman remained with patient and his niece and provided comfort and support which is what the niece needed the most. I provided reassurance and support and resumed his comfort medication-patient having significant vocalizations at EOL.  Filed Vitals:   07-Apr-2014 0621  BP: 110/74  Pulse: 80  Temp: 98 F (36.7 C)  Resp: 18   Course rhonchi, Rate 35-loud SEM, moaning, obtunded, diaphoretic  Goals:  1. Family understand he is near EOL. 2. Refuse Hospice- unable to fully provide education 3. Specifically request a hospital death. 4. They request a chaplain and prayer at bedside.   Appreciate the help of staff on floor in handling this difficult EOL situation with family.  Time: 1PM-1:36 35 minutes  Lane Hacker, DO Palliative Medicine

## 2014-03-25 NOTE — Progress Notes (Signed)
Pt's niece was bedside when I arrived. Pall Care team/nurse staff present and caring for pt. Pt's niece was principle caregiver and tearful. Provided emotional support; spiritual spt/prayer.  Per niece req, I attempted to reach her pastor. Left voicemail and sent email w/request to contact hospital Chaplain. Provided extended presence w/niece and encouraged her to call other family members, since she was alone. Niece described divisive family dynamics w/pt siblings, but said she would call.  Pls page chaplain for spt should pt pass.  [place]  Mar 29, 2014 1400  Clinical Encounter Type  Visited With Family

## 2014-03-25 NOTE — Progress Notes (Signed)
Clinical Social Work  Patient was discussed during progression meeting and MD feels that patient will have hospital death. CSW alerted [employer] [place]) of patient's DC plans. CSW went to room but no family present. CSW will continue to follow to assist as needed and provide emotional support for family.  [place], [place] [phone]

## 2014-03-25 NOTE — Progress Notes (Signed)
Patient found with niece and chaplain at bedside without breathing or a pulse.  Death witnessed by [employer].  Dr. Coralyn Pear notified.  Durwin Nora RN

## 2014-03-25 NOTE — Discharge Summary (Addendum)
Death Summary  Tunis Gentle DTO:[id number] DOB: 1951-04-27 DOA: 03-16-2014  PCP: Elyn Peers, MD PCP/Office notified:   Admit date: 03-16-14 Date of Death: 03/17/2014  Final Diagnoses:  Principal Problem:   Acute renal failure Active Problems:   COPD (chronic obstructive pulmonary disease)   Liver cancer   possible CAP (community acquired pneumonia)   Pancreatitis   Metabolic acidosis   Multiple falls   Weakness   DM (diabetes mellitus), type 2 with renal complications   Acute systolic heart failure   Hypertension Severe Protein Calorie Malnutrition    History of present illness:  John Casey is a 63 y.o. male  With a history of liver cancer, alcohol abuse and hepatitis C, diabetes, hypertension, COPD presents with progressive weakness and multiple falls. He is a poor historian but apparently lives at home alone. He has not yet been seen by Dr. Benay Spice, appointment is scheduled next week. He has poor appetite. He has chronic leg edema. He has increasing abdominal distention. His appetite is poor. He is making urine, but not much. In the emergency room, multiple abnormalities are found. Chest x-ray shows pulmonary edema versus pneumonia. White blood cell count is 16,000. Bicarbonate is 16. BUN is 110. Creatinine 6.72. Pro BNP greater than 30,000. Echocardiogram from 2003 as difficult study but showed an ejection fraction of approximately 30-35%. Patient denies any vomiting. He denies dyspnea or chest pain.   Hospital Course:  Patient is an unfortunate 63 year old gentleman with a past medical history of liver mass, status post CT scan of abdomen and pelvis that was performed on 12/02/2013 that showed extensive infiltrating lesions throughout the right hepatic lobe findings compatible with infiltrative hepatocellular carcinoma. He had an upcoming appointment with medical oncology. He was admitted to the medicine service on 16-Mar-2014 presenting with functional decline, generalized  weakness, failure to thrive. Failure to thrive likely multifactorial as progression of underlying malignancy, infection, acute renal failure, dehydration and multiple comorbidities all likely contributors. Chest x-ray showed increased interstitial markings and left basilar airspace opacities that could reflect pneumonia. He was started on empiric IV antimicrobial therapy with azithromycin and ceftriaxone. Initial labs revealing a creatinine of 6.72 , representing a significant increase from 0.73 on 02/25/2014. This likely reflecting hypovolemia as well as end organ failure in setting of advanced malignancy. On the morning of 03/16/2014 goals of care were discussed with patient. He expressed to me his wishes to focus his care on comfort, and expressed interest in transition to hospice. Later that morning I spoke with his sister Neoma Laming will fully support her brother's decision. Palliative care was consulted as patient was seen and evaluated by Dr. Hilma Favors. He was transitioned to full comfort. By the following morning patient having a significant decline, he coming unresponsive and having agonal breaths. Later that day patient's niece Levada Dy presented to his room, upset with staff members, pulled down to patient's IV. I spoke with Levada Dy over telephone attempting to explain patient's condition. She stated that she was Mr [facility] and wanted his his DO NOT RESUSCITATE status rescinded and for life saving measures to be undertaken. I requested a second opinion from Dr Rockne Menghini. Dr Rockne Menghini, Dr Hilma Favors and myself met with Levada Dy. Patient's condition had been explained. Staff offered support. Spiritual care was consulted. She agreed with reestablishing goals of care to be on comfort rather than pursuing further invasive/burdensome interventions. Patient was restarted on IV dilaudid. He expired at 5:00 PM on March 17, 2014   Time: 5:51 pm  Signed:  Kelvin Cellar  [facility] 04-10-14, 5:37 PM

## 2014-03-25 NOTE — Progress Notes (Signed)
This nurse witnessed discussion between Dr. Coralyn Pear, and patient concerning prognosis and end of life care.  Patient expressed desire for comfort measures only, and was made aware of his poor prognosis.  Patient was in agreement with orders to not resuscitate. Patient alert and oriented, and capable of making decisions on his own behalf.  Patient in full agreement of discussing comfort measures further with palliative medicine team.  Durwin Nora RN

## 2014-03-25 NOTE — Progress Notes (Signed)
[facility] PROGRESS NOTE  John Casey YOK:[id number] DOB: [birth year] DOA: 03/07/2014 PCP: Elyn Peers, MD  Assessment/Plan: 1. Failure to thrive/functional decline -Likely multifactorial as progression of underlying malignancy, infection, acute renal failure, dehydration, and his multiple comorbidities all likely contributors -Providing gentle IV fluid resuscitation with normal saline at 60 mL's per hour and empiric antibiotic therapy with ceftriaxone 1 g IV every 24 hours and azithromycin 500 mg IV every 24 hour   2. Goals of care -Patient was transitioned to comfort care, palliative care has seen him. On Dilaudid PCA. Likely pass in the next 24 hours.   Code Status: DNR Family Communication: I spoke with his sister over telephone conversation Disposition Plan: Having agonal breaths, will likely pass in the next 24 hours   Consultants:  Palliative care   Antibiotics:  Rocephin  Azithromycin  HPI/Subjective: Patient is a pleasant 63 year old gentleman with multiple comorbidities, as a CT scan of abdomen performed on 12/02/2013 showed extensive infiltrating lesions throughout the right hepatic lobe largest measuring 8.7 cm most compatible with infiltrative hepatocellular carcinoma. He presented to the emergency room on 03/10/2014 with complaints of functional decline, failure to thrive, generalized weakness and having recurrent falls. He had an upcoming appointment with Dr. Benay Spice of medical oncology.   Now transitioned to comfort care  Objective: Filed Vitals:   Mar 29, 2014 0621  BP: 110/74  Pulse: 80  Temp: 98 F (36.7 C)  Resp: 18    Intake/Output Summary (Last 24 hours) at 2014/03/29 1116 Last data filed at March 29, 2014 0458  Gross per 24 hour  Intake   1062 ml  Output      0 ml  Net   1062 ml   Filed Weights   02/24/2014 0637  Weight: 91.1 kg (200 lb 13.4 oz)    Exam:   General: Having agonal breaths, unresponsive.   Cardiovascular: Regular rate and  rhythm, normal S1S2  Respiratory: Coarse respiratory sounds, bibasilar crackles.   Abdomen: Mild to moderate generalized pain to deep palpation   Data Reviewed: Basic Metabolic Panel:  Recent Labs Lab 03/19/2014 0245 02/24/2014 0715  NA 134*  --   K 5.3  --   CL 93*  --   CO2 16*  --   GLUCOSE 116*  --   BUN 110*  --   CREATININE 6.72*  --   CALCIUM 9.2  --   MG  --  2.8*  PHOS  --  7.6*   Liver Function Tests:  Recent Labs Lab 03/10/2014 0245  AST 98*  ALT 23  ALKPHOS 535*  BILITOT 1.6*  PROT 8.6*  ALBUMIN 2.4*    Recent Labs Lab 03/19/2014 0245  LIPASE 587*    Recent Labs Lab 03/10/2014 0230  AMMONIA 27   CBC:  Recent Labs Lab 03/06/2014 0245  WBC 16.1*  NEUTROABS 13.4*  HGB 11.7*  HCT 34.2*  MCV 76.2*  PLT 280   Cardiac Enzymes:  Recent Labs Lab 03/22/2014 0245  TROPONINI <0.30   BNP (last 3 results)  Recent Labs  03/17/2014 0308  PROBNP 31377.0*   CBG:  Recent Labs Lab 03/10/2014 0722 02/25/2014 1124 03/23/2014 1643 03/17/2014 2105  GLUCAP 133* 125* 120* 118*    Recent Results (from the past 240 hour(s))  CULTURE, BLOOD (ROUTINE X 2)     Status: None   Collection Time    03/23/2014  2:30 AM      Result Value Ref Range Status   Specimen Description BLOOD LEFT ANTECUBITAL   Final  Special Requests BOTTLES DRAWN AEROBIC AND ANAEROBIC 5CC   Final   Culture  Setup Time     Final   Value: 02/26/2014 09:47     Performed at [facility]   Culture     Final   Value:        BLOOD CULTURE RECEIVED NO GROWTH TO DATE CULTURE WILL BE HELD FOR 5 DAYS BEFORE ISSUING A FINAL NEGATIVE REPORT     Performed at [facility]   Report Status PENDING   Incomplete  CULTURE, BLOOD (ROUTINE X 2)     Status: None   Collection Time    03/19/2014  3:08 AM      Result Value Ref Range Status   Specimen Description BLOOD RIGHT HAND   Final   Special Requests BOTTLES DRAWN AEROBIC AND ANAEROBIC 5CC   Final   Culture  Setup Time     Final   Value:  03/09/2014 09:46     Performed at [facility]   Culture     Final   Value:        BLOOD CULTURE RECEIVED NO GROWTH TO DATE CULTURE WILL BE HELD FOR 5 DAYS BEFORE ISSUING A FINAL NEGATIVE REPORT     Performed at [facility]   Report Status PENDING   Incomplete     Studies: US Renal  03/08/2014   CLINICAL DATA:  Acute renal failure.  Known liver cancer.  EXAM: RENAL/URINARY TRACT ULTRASOUND COMPLETE  COMPARISON:  Ultrasound, 02/25/2014.  FINDINGS: Right Kidney:  Length: 11.7 cm. 2.6 cm mass lies along the periphery of the right kidney, along its anterior lateral aspect, adjacent to the midpole. This was not evident on the prior renal ultrasound or previous CT. This is most likely not of renal origin, more likely either retroperitoneal metastatic disease or direct extension of neoplastic disease from the liver. Normal parenchymal echogenicity. No hydronephrosis.  Left Kidney:  Length: 11.6 cm. Echogenicity within normal limits. No mass or hydronephrosis visualized.  Bladder:  Appears normal for degree of bladder distention.  Small amount of ascites.  IMPRESSION: 1. No hydronephrosis. 2. 2.6 cm soft tissue mass along the periphery of the right kidney. 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 3. Kidneys otherwise unremarkable.   Electronically Signed   By: Lajean Manes M.D.   On: 03/16/2014 10:27   Dg Chest Port 1 View  03/10/2014   CLINICAL DATA:  Hypoxia.  EXAM: PORTABLE CHEST - 1 VIEW  COMPARISON:  Chest radiograph and CT of the chest performed 05/09/2011  FINDINGS: The lungs are hypoexpanded. Vascular crowding and vascular congestion are seen. Increased interstitial markings and left basilar airspace opacities may reflect pulmonary edema or pneumonia. A small left pleural effusion is suspected. No pneumothorax is seen.   The cardiomediastinal silhouette is borderline normal in size. No acute osseous abnormalities are identified.  IMPRESSION: Lungs hypoexpanded. Vascular congestion noted. Increased interstitial markings and left basilar airspace opacities may reflect pulmonary edema or pneumonia. Suspect small left pleural effusion.   Electronically Signed   By: Garald Balding M.D.   On: 03/09/2014 03:00    Scheduled Meds: . LORazepam  1 mg Intravenous q12n4p  . sodium chloride  3 mL Intravenous Q12H   Continuous Infusions: . sodium chloride 60 mL/hr at March 21, 2014 0458  . HYDROmorphone 0.5 mg/hr (  2014-03-28 1110)    Principal Problem:   Acute renal failure Active Problems:   COPD (chronic obstructive pulmonary disease)   Liver cancer   possible CAP (community acquired pneumonia)   Pancreatitis   Metabolic acidosis   Multiple falls   Weakness   DM (diabetes mellitus), type 2 with renal complications   Acute systolic heart failure   Hypertension    Time spent: 25 min    John Casey  [facility] Pager [phone]. If 7PM-7AM, please contact night-coverage at [email or web address], password [facility] 28-Mar-2014, 11:16 AM  LOS: 1 day

## 2014-03-25 NOTE — Progress Notes (Signed)
Alerted by staff of upset family member who was verbally abusive to staff, removed patient IV, and still at bedside with POA forms.  Entered patient room, patient niece Gabrielle Dare upset related to patient deteriorating health status and pain medication ordered.  Roe Coombs [facility] present in room, discussed with niece, patients documented health care wishes noted in physician documentation.   Niece agreeable with healthcare plan to restart IV and pain medication administration.  MD and chaplain notified.  IV restarted, medication continued.

## 2014-03-25 DEATH — deceased
